# Patient Record
Sex: Female | Born: 1950 | Race: White | Hispanic: No | Smoking: Former smoker
Health system: Southern US, Community
[De-identification: ages and names within clinical notes are randomized; demographics above are authoritative.]

## PROBLEM LIST (undated history)

## (undated) DIAGNOSIS — I10 Essential (primary) hypertension: Secondary | ICD-10-CM

## (undated) DIAGNOSIS — M1990 Unspecified osteoarthritis, unspecified site: Secondary | ICD-10-CM

## (undated) DIAGNOSIS — I890 Lymphedema, not elsewhere classified: Secondary | ICD-10-CM

## (undated) DIAGNOSIS — G4730 Sleep apnea, unspecified: Secondary | ICD-10-CM

## (undated) DIAGNOSIS — E785 Hyperlipidemia, unspecified: Secondary | ICD-10-CM

## (undated) DIAGNOSIS — F32A Depression, unspecified: Secondary | ICD-10-CM

## (undated) DIAGNOSIS — R42 Dizziness and giddiness: Secondary | ICD-10-CM

## (undated) DIAGNOSIS — F419 Anxiety disorder, unspecified: Secondary | ICD-10-CM

## (undated) DIAGNOSIS — R519 Headache, unspecified: Secondary | ICD-10-CM

## (undated) DIAGNOSIS — Z87442 Personal history of urinary calculi: Secondary | ICD-10-CM

## (undated) DIAGNOSIS — N189 Chronic kidney disease, unspecified: Secondary | ICD-10-CM

## (undated) HISTORY — PX: EYE SURGERY: SHX253

## (undated) HISTORY — PX: APPENDECTOMY: SHX54

## (undated) HISTORY — PX: BREAST SURGERY: SHX581

## (undated) HISTORY — PX: JOINT REPLACEMENT: SHX530

---

## 2017-06-07 DIAGNOSIS — I82409 Acute embolism and thrombosis of unspecified deep veins of unspecified lower extremity: Secondary | ICD-10-CM

## 2017-06-07 HISTORY — DX: Acute embolism and thrombosis of unspecified deep veins of unspecified lower extremity: I82.409

## 2020-01-09 ENCOUNTER — Ambulatory Visit: Payer: Self-pay

## 2020-01-09 ENCOUNTER — Ambulatory Visit: Payer: Medicare Other | Admitting: Orthopaedic Surgery

## 2020-01-09 VITALS — Ht 65.0 in | Wt 237.0 lb

## 2020-01-09 DIAGNOSIS — M1611 Unilateral primary osteoarthritis, right hip: Secondary | ICD-10-CM | POA: Diagnosis not present

## 2020-01-09 DIAGNOSIS — Z96651 Presence of right artificial knee joint: Secondary | ICD-10-CM | POA: Diagnosis not present

## 2020-01-09 DIAGNOSIS — M25551 Pain in right hip: Secondary | ICD-10-CM | POA: Diagnosis not present

## 2020-01-09 NOTE — Progress Notes (Signed)
Office Visit Note   Patient: Monica Serrano           Date of Birth: July 15, 1950           MRN: [id number] Visit Date: 01/09/2020              Requested by: Eulis Foster, MD [address] [place],  [place] [address] PCP: Eulis Foster, MD   Assessment & Plan: Visit Diagnoses:  1. Pain in right hip   2. History of total right knee replacement   3. Unilateral primary osteoarthritis, right hip     Plan: Given the severity of her right hip arthritis on clinical exam and plain films, we are recommending a total hip arthroplasty.  She is a fall risk at this point given the severity of her hip pain on the right side.  I do feel this is causing her to have right knee issues as well and hopefully with a hip replacement this will correct a lot of those issues.  I had a long and thorough discussion about hip replacement surgery.  We talked about the risk of acute blood loss anemia, nerve vessel injury, soft tissue issues given her obesity, DVT, dislocation and implant failure.  We talked about the goals being decrease pain improve mobility and overall improve quality of life.  I talked about the interoperative and postoperative course and what to expect with surgery.  All questions and concerns were answered and addressed.  We will work on getting this scheduled.  I do feel this is medically necessary at this point given failed conservative treatment and given the severity of arthritis on plain films and clinical exam.  Follow-Up Instructions: Return for 2 weeks post-op.   Orders:  Orders Placed This Encounter  Procedures  . XR HIP UNILAT W OR W/O PELVIS 1V RIGHT  . XR Knee 1-2 Views Right   No orders of the defined types were placed in this encounter.     Procedures: No procedures performed   Clinical Data: No additional findings.   Subjective: Chief Complaint  Patient presents with  . Right Hip - Pain  . Right Knee - Pain  Patient is a very pleasant  69 year old who comes from [facility] to evaluate and treat severe right hip pain.  She does have a history of bilateral knee replacements.  She ambulates with a cane.  Her knee replacements are done in [place].  Her right knee was replaced in 2019 her left knee in 2018.  She still having a lot of problems which she feels is clicking in her right knee but most of her problems is severe right hip and groin pain.  She is not a diabetic.  She is 237 pounds with a BMI of 39.  She is worked on losing weight and has lost about 35 pounds.  She has a lot of problems with her gait and mobility.  She does ambulate with a Trendelenburg gait and holding onto walls when I watched her walk around the office.  Her hip pain is 10 out of 10 and is daily.  Her right hip is now detrimentally affecting her mobility, her quality of life and her actives daily living.  This is been going on for over a year and getting significantly worse.  She has tried and failed all forms conservative treatment and has had even a right hip steroid injection which helped minimally.  HPI  Review of Systems She currently denies any headache,  chest pain, shortness of breath, fever, chills, nausea, vomiting  Objective: Vital Signs: Ht 5\' 5"  (1.651 m)   Wt 237 lb (107.5 kg)   BMI 39.44 kg/m   Physical Exam She is alert and orient x3 and in no acute distress Ortho Exam Examination of her right hip she is essentially severe limitations in range of motion with stiffness and severe pain.  There is actually a crepitation of the hip that is radiating down to the knee.  Both the right and left knees have good flexion and extension.  There is some slight clicking more in the right knee than left knee. Specialty Comments:  No specialty comments available.  Imaging: XR HIP UNILAT W OR W/O PELVIS 1V RIGHT  Result Date: 01/09/2020 A low AP pelvis lateral the right hip shows severe end-stage arthritis of that hip.  There is a large  cystic change in the femoral head and acetabulum.  There is collapse of the femoral head and essentially loss of the joint space.  There is large para-articular osteophytes and sclerotic changes.  XR Knee 1-2 Views Right  Result Date: 01/09/2020 2 views of the right knee show a well-seated total knee arthroplasty with no complicating features.    PMFS History: Patient Active Problem List   Diagnosis Date Noted  . Unilateral primary osteoarthritis, right hip 01/09/2020   No past medical history on file.  No family history on file.   Social History   Occupational History  . Not on file  Tobacco Use  . Smoking status: Not on file  Substance and Sexual Activity  . Alcohol use: Not on file  . Drug use: Not on file  . Sexual activity: Not on file

## 2020-01-16 ENCOUNTER — Other Ambulatory Visit: Payer: Self-pay

## 2020-02-05 ENCOUNTER — Encounter (HOSPITAL_COMMUNITY): Admission: RE | Admit: 2020-02-05 | Payer: Medicare Other | Source: Ambulatory Visit

## 2020-02-05 ENCOUNTER — Other Ambulatory Visit (HOSPITAL_COMMUNITY): Payer: Medicare Other

## 2020-02-08 ENCOUNTER — Ambulatory Visit: Admit: 2020-02-08 | Payer: Medicare Other | Admitting: Orthopaedic Surgery

## 2020-02-08 SURGERY — ARTHROPLASTY, HIP, TOTAL, ANTERIOR APPROACH
Anesthesia: Spinal | Site: Hip | Laterality: Right

## 2020-02-21 ENCOUNTER — Inpatient Hospital Stay: Payer: Medicare Other | Admitting: Orthopaedic Surgery

## 2020-03-12 ENCOUNTER — Other Ambulatory Visit: Payer: Self-pay

## 2020-03-14 ENCOUNTER — Other Ambulatory Visit: Payer: Self-pay | Admitting: Physician Assistant

## 2020-03-18 ENCOUNTER — Encounter (HOSPITAL_COMMUNITY): Payer: Self-pay

## 2020-03-18 ENCOUNTER — Other Ambulatory Visit (HOSPITAL_COMMUNITY)
Admission: RE | Admit: 2020-03-18 | Discharge: 2020-03-18 | Disposition: A | Discharge status: Home / Self Care | Payer: Medicare Other | Source: Ambulatory Visit | Attending: Orthopaedic Surgery | Admitting: Orthopaedic Surgery

## 2020-03-18 ENCOUNTER — Other Ambulatory Visit: Payer: Self-pay

## 2020-03-18 ENCOUNTER — Encounter (HOSPITAL_COMMUNITY)
Admission: RE | Admit: 2020-03-18 | Discharge: 2020-03-18 | Disposition: A | Discharge status: Home / Self Care | Payer: Medicare Other | Source: Ambulatory Visit | Attending: Orthopaedic Surgery | Admitting: Orthopaedic Surgery

## 2020-03-18 DIAGNOSIS — Z01818 Encounter for other preprocedural examination: Secondary | ICD-10-CM | POA: Insufficient documentation

## 2020-03-18 DIAGNOSIS — Z01810 Encounter for preprocedural cardiovascular examination: Secondary | ICD-10-CM | POA: Diagnosis present

## 2020-03-18 DIAGNOSIS — Z01812 Encounter for preprocedural laboratory examination: Secondary | ICD-10-CM | POA: Diagnosis not present

## 2020-03-18 DIAGNOSIS — Z20822 Contact with and (suspected) exposure to covid-19: Secondary | ICD-10-CM | POA: Insufficient documentation

## 2020-03-18 HISTORY — DX: Personal history of urinary calculi: Z87.442

## 2020-03-18 HISTORY — DX: Lymphedema, not elsewhere classified: I89.0

## 2020-03-18 HISTORY — DX: Unspecified osteoarthritis, unspecified site: M19.90

## 2020-03-18 HISTORY — DX: Dizziness and giddiness: R42

## 2020-03-18 HISTORY — DX: Anxiety disorder, unspecified: F41.9

## 2020-03-18 HISTORY — DX: Hyperlipidemia, unspecified: E78.5

## 2020-03-18 HISTORY — DX: Essential (primary) hypertension: I10

## 2020-03-18 HISTORY — DX: Sleep apnea, unspecified: G47.30

## 2020-03-18 HISTORY — DX: Headache, unspecified: R51.9

## 2020-03-18 HISTORY — DX: Depression, unspecified: F32.A

## 2020-03-18 HISTORY — DX: Chronic kidney disease, unspecified: N18.9

## 2020-03-18 LAB — BASIC METABOLIC PANEL
Anion gap: 12 (ref 5–15)
BUN: 26 mg/dL — ABNORMAL HIGH (ref 8–23)
CO2: 24 mmol/L (ref 22–32)
Calcium: 9.5 mg/dL (ref 8.9–10.3)
Chloride: 100 mmol/L (ref 98–111)
Creatinine, Ser: 2.03 mg/dL — ABNORMAL HIGH (ref 0.44–1.00)
GFR, Estimated: 24 mL/min — ABNORMAL LOW (ref >60)
Glucose, Bld: 107 mg/dL — ABNORMAL HIGH (ref 70–99)
Potassium: 4.8 mmol/L (ref 3.5–5.1)
Sodium: 136 mmol/L (ref 135–145)

## 2020-03-18 LAB — SURGICAL PCR SCREEN
MRSA, PCR: NEGATIVE
Staphylococcus aureus: POSITIVE — AB

## 2020-03-18 LAB — CBC
HCT: 43.4 % (ref 36.0–46.0)
Hemoglobin: 13.2 g/dL (ref 12.0–15.0)
MCH: 27.7 pg (ref 26.0–34.0)
MCHC: 30.4 g/dL (ref 30.0–36.0)
MCV: 91.2 fL (ref 80.0–100.0)
Platelets: 439 10*3/uL — ABNORMAL HIGH (ref 150–400)
RBC: 4.76 MIL/uL (ref 3.87–5.11)
RDW: 15.4 % (ref 11.5–15.5)
WBC: 9.8 10*3/uL (ref 4.0–10.5)
nRBC: 0 % (ref 0.0–0.2)

## 2020-03-18 LAB — TYPE AND SCREEN
ABO/RH(D): B POS
Antibody Screen: NEGATIVE

## 2020-03-18 LAB — SARS CORONAVIRUS 2 (TAT 6-24 HRS): SARS Coronavirus 2: NEGATIVE

## 2020-03-18 NOTE — Progress Notes (Addendum)
[facility].  Cardiologist - none  Chest x-ray - na  EKG - 03/18/20  Stress Test - never  ECHO - never  Cardiac Cath - never  Sleep Study -  CPAP -   LABS-CBC, BMP, PCR ASA-  ERAS-Yes.  No Food after midnight- clear liquids until 0630- Drink Pre Surgery Ensure between 0530 and 0630.  HA1C-na Fasting Blood Sugar - na Checks Blood Sugar __na___ times a day  I instructed patient to stop Phentermine and Vitamins.  Anesthesia- History of Vertigo- takes Phenergan and lies down.  Last attack was 6 months ago. Pt denies having chest pain, sob, or fever at this time. All instructions explained to the pt, with a verbal understanding of the material. Pt agrees to go over the instructions while at home for a better understanding. Pt also instructed to self quarantine after being tested for COVID-19. The opportunity to ask questions was provided.

## 2020-03-18 NOTE — Pre-Procedure Instructions (Addendum)
Monica Serrano  03/18/2020      Your procedure is scheduled on Friday, October 15.  Report to [facility], Main Entrance or Entrance "A" at 7:30 AM                     Your surgery or procedure is scheduled to begin at   Call this number if you have problems the morning of surgery: [phone]  This is the number for the Pre- Surgical Desk.                For any other questions, please call [phone], Monday - Friday 8 AM - 4 PM.   Remember:  Do not eat  after midnight Thursday, October 14.  You may drink clear liquids until 6:30 AM .  Clear liquids allowed are:                     Water, Juice (non-citric and without pulp - diabetics please choose diet or no sugar options), Carbonated beverages - (diabetics please choose diet or no sugar options), Clear Tea, Black Coffee only (no creamer, milk or cream including half and half), Plain Jell-O only (diabetics please choose diet or no sugar options), Gatorade (diabetics please choose diet or no sugar options) and Plain Popsicles only.  Drink Pre- Surgery between 5:30 AM and 6:30 AM , then DO NOT drink anything after 6:30 AM     Take these medicines the morning of surgery with A SIP OF WATER: atorvastatin (LIPITOR) DULoxetine (CYMBALTA)  gabapentin (NEURONTIN) lansoprazole (PREVACID)  May take if needed:  acetaminophen (TYLENOL)   cetirizine (ZYRTEC)  fluticasone (FLONASE)  oxyCODONE-acetaminophen (PERCOCET)   1 Week prior to surgery STOP taking phentermine Aspirin, Aspirin Products (Goody Powder, Excedrin Migraine), Ibuprofen (Advil), Naproxen (Aleve), Vitamins and Herbal Products (ie Fish Oil).    Special instructions:    [place]- Preparing For Surgery  Before surgery, you can play an important role. Because skin is not sterile, your skin needs to be as free of germs as possible. You can reduce the number of germs on your skin by washing with CHG (chlorahexidine gluconate) Soap before surgery.  CHG is an  antiseptic cleaner which kills germs and bonds with the skin to continue killing germs even after washing.    Oral Hygiene is also important to reduce your risk of infection.  Remember - BRUSH YOUR TEETH THE MORNING OF SURGERY WITH YOUR REGULAR TOOTHPASTE  Please do not use if you have an allergy to CHG or antibacterial soaps. If your skin becomes reddened/irritated stop using the CHG.  Do not shave (including legs and underarms) for at least 48 hours prior to first CHG shower. It is OK to shave your face.  Please follow these instructions carefully.   1. Shower the NIGHT BEFORE SURGERY and the MORNING OF SURGERY with CHG.   2. If you chose to wash your hair, wash your hair first as usual with your normal shampoo.  3. After you shampoo, wash your face and private area with the soap you use at home, then rinse your hair and body thoroughly to remove the shampoo and soap.  4. Use CHG as you would any other liquid soap. You can apply CHG directly to the skin and wash gently with a scrungie or a clean washcloth.   5. Apply the CHG Soap to your body ONLY FROM THE NECK DOWN.  Do not use on open wounds or open  sores. Avoid contact with your eyes, ears, mouth and genitals (private parts).   6. Wash thoroughly, paying special attention to the area where your surgery will be performed.  7. Thoroughly rinse your body with warm water from the neck down.  8. DO NOT shower/wash with your normal soap after using and rinsing off the CHG Soap.  9. Pat yourself dry with a CLEAN TOWEL.  10. Wear CLEAN PAJAMAS to bed the night before surgery, wear comfortable clothes the morning of surgery  11. Place CLEAN SHEETS on your bed the night of your first shower and DO NOT SLEEP WITH PETS.  Day of Surgery: Shower as instructed above. Do not apply any deodorants/lotions, powders or colognes.  Please wear clean clothes to the hospital/surgery center.   Remember to brush your teeth WITH YOUR REGULAR  TOOTHPASTE.  Do not wear jewelry, make-up or nail polish.  Do not shave 48 hours prior to surgery.  Men may shave face and neck.  Do not bring valuables to the hospital.  [facility] is not responsible for any belongings or valuables.  Contacts, dentures or bridgework may not be worn into surgery.  Leave your suitcase in the car.  After surgery it may be brought to your room.  For patients admitted to the hospital, discharge time will be determined by your treatment team.  Patients discharged the day of surgery will not be allowed to drive home.   Please read over the fact sheets that you were given.

## 2020-03-19 ENCOUNTER — Encounter (HOSPITAL_COMMUNITY): Payer: Self-pay

## 2020-03-19 NOTE — Progress Notes (Addendum)
Anesthesia Chart Review:  Case: [id number] Date/Time: 03/21/20 0915   Procedure: RIGHT TOTAL HIP ARTHROPLASTY ANTERIOR APPROACH (Right Hip)   Anesthesia type: Spinal   Pre-op diagnosis: osteoarthritis right hip   Location: [facility] OR ROOM 02 / [place] OR   Surgeons: Mcarthur Rossetti, MD      DISCUSSION: Patient is a 69 year old female scheduled for the above procedure.  History includes former smoker (quit 06/07/86), HTN, HLD, OSA (severe 12/2018 sleep study, inconsistent use of CPAP), RLE DVT (post-TKA 2019) , migraines, anxiety, depression, vertigo, lymphedema (BLE), TKA (left 08/17/16, right 09/06/17). BMI is consistent with obesity.   Preoperative labs show a Creatinine of 2.03, BUN 26, eGFR 24. Last available Creatinine in Care Everywhere was 0.73 on 10/05/18. Dr Deland Pretty is out of the office on the afternoon of 03/19/20, but I spoke with Sharilyn Sites, NP who was able to tell me that her last labs on 12/06/19 showed a Cr 1.37, BUN 22, GFR 40. Dr. Deland Pretty will be back in the office on 03/20/20, so labs will be forwarded for his review and recommendations.  I did contact Ms. Uzelac to review her lab results and that we were seeking primary care input to determine if this was more of an acute or chronic issue. She believes her Creatinine has been mildly elevated in the past and thought she may have had a renal U/S. A few months ago, she had taken ibuprofen 800 mg TID x 4 doses for right knee pain, but otherwise denied any long term recent NSAID use. She denied any new urinary symptoms (has chronic frequent urination) and feels lymphedema is controlled (using Lymphopress BID). Spironolactone 25 mg was stopped about 6-8 months ago, but she continued on Lasix 40 mg daily and candesartan 16 mg daily. She reports what sounds like intentional weight loss of 35 lbs since Mother's Day in preparation for surgery. She is a widow since 2017 and lives alone. She reportedly has arranged family to assist her for 8 days after  surgery, but is still concerned she may need higher level of care given some right knee popping and pain as well (Dr. Ninfa Linden to follow post-op).   Presurgical COVID-19 test negative on 03/18/2020. PAT labs and EKG forwarded to Dr. Quenten Raven. Will leave chart for follow-up Dr. Deland Pretty recommendations.   ADDENDUM 03/20/20 9:20 AM: Received input from Dr. Deland Pretty after review of 03/18/20 EKG and labs. He wrote, "THIS PT HAS CHRONIC RENAL INSUF WITH CREAT'S FROM 1.1 TO 1.9 FOR THE PAST 1-2 YEARS. APPROVED TO PROCEED WITH SURGERY AS SCHEDULED." History updated. Message sent to Dr. Ninfa Linden with update. Anesthesia team to evaluate on the day of surgery.    VS: BP (!) 111/56    Pulse 86    Temp 36.9 C (Oral)    Resp 20    Ht $R'5\' 5"'Uh$  (1.651 m)    Wt 103.3 kg    SpO2 100%    BMI 37.89 kg/m    PROVIDERS: Mike Gip, MD is PCP [facility]) - She received pain management at [facility] by Mauricio Po, PA-C. Last visit 03/17/20 and is aware of surgery plans.   LABS: Preoperative labs reviewed. See DISCUSSION. (all labs ordered are listed, but only abnormal results are displayed)  Labs Reviewed  SURGICAL PCR SCREEN - Abnormal; Notable for the following components:      Result Value   Staphylococcus aureus POSITIVE (*)    All other components within normal limits   Lab Results  Component Value Date   WBC 9.8 03/18/2020   HGB 13.2 03/18/2020   HCT 43.4 03/18/2020   PLT 439 (H) 03/18/2020   GLUCOSE 107 (H) 03/18/2020   NA 136 03/18/2020   K 4.8 03/18/2020   CL 100 03/18/2020   CREATININE 2.03 (H) 03/18/2020   BUN 26 (H) 03/18/2020   CO2 24 03/18/2020    OTHER:  - [facility]/29/18 [facility]): ASSESSMENT: Severe obstructive sleep apneawith an AHI of 81.1 events/hr, arousal index of 57.9 per hour and a low saturation of 64.0%. There were moderate PLMS with an index of 41.8 /hr and PLMS arousal index of 0.8  /hr. EKG indicates normal sinusrhythm. EEG is normal.  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 There were no PLMS with an index of 0.0 /hr and PLMS arousal index of 0.0 /hr. EKG indicates normal sinus rhythm. EEG is normal. RECOMMENDATIONS: A titration of VPAP with ASV is recommended for this patient with complex apnea syndrome poorly tolerant of CPAP and Bilevel. If the patient is poorly tolerant to PAP, further anatomic evaluation for possible surgical intervention may also be considered...   EKG: 03/18/20:  Normal sinus rhythm Low voltage QRS Poor R wave progression Cannot rule out Anterior infarct , age undetermined Abnormal ECG No old tracing to compare Confirmed by Candee Furbish [phone]) on 03/18/2020 8:29:50 PM   CV: Denied prior stress test, echocardiogram, cardiac cath   Past Medical History:  Diagnosis Date   Anxiety    CKD (chronic kidney disease)    Depression    DVT, lower extremity ([place]) 2019   right leg   Headache    Migraines - years ago   History of kidney stones    passed   Hyperlipemia    Hypertension    Lymphedema    Osteoarthritis    Sleep apnea    Vertigo     Past Surgical History:  Procedure Laterality Date   BREAST SURGERY Right    Biospy   EYE SURGERY Bilateral    Catartact   JOINT REPLACEMENT     knees    MEDICATIONS:  acetaminophen (TYLENOL) 500 MG tablet   atorvastatin (LIPITOR) 20 MG tablet   calcium carbonate (OSCAL) 1500 (600 Ca) MG TABS tablet   candesartan (ATACAND) 16 MG tablet   cetirizine (ZYRTEC) 10 MG tablet   cholecalciferol (VITAMIN D3) 25 MCG (1000 UNIT) tablet   docusate sodium (COLACE) 100 MG capsule   DULoxetine (CYMBALTA) 60 MG capsule   fluticasone (FLONASE) 50 MCG/ACT nasal spray   furosemide (LASIX) 40 MG tablet    gabapentin (NEURONTIN) 300 MG capsule   ibuprofen (ADVIL) 200 MG tablet   lansoprazole (PREVACID) 30 MG capsule   oxyCODONE-acetaminophen (PERCOCET) 7.5-325 MG tablet   phentermine 30 MG capsule   promethazine (PHENERGAN) 25 MG tablet   rOPINIRole (REQUIP) 1 MG tablet   spironolactone (ALDACTONE) 25 MG tablet   No current facility-administered medications for this encounter.  Phentermine on hold for surgery. Off spironolactone 25 mg daily--off 6-8 months.      Myra Gianotti, PA-C Surgical Short Stay/Anesthesiology [facility] Phone [phone] [facility] Phone [phone] 03/19/2020 4:57 PM

## 2020-03-20 ENCOUNTER — Encounter (HOSPITAL_COMMUNITY): Payer: Self-pay

## 2020-03-20 NOTE — Anesthesia Preprocedure Evaluation (Addendum)
Anesthesia Evaluation  Patient identified by MRN, date of birth, ID band Patient awake    Reviewed: Allergy & Precautions, H&P , NPO status , Patient's Chart, lab work & pertinent test results  Airway Mallampati: II   Neck ROM: full    Dental   Pulmonary sleep apnea , former smoker,    breath sounds clear to auscultation       Cardiovascular hypertension,  Rhythm:regular Rate:Normal     Neuro/Psych  Headaches, PSYCHIATRIC DISORDERS Anxiety Depression    GI/Hepatic   Endo/Other    Renal/GU Renal disease     Musculoskeletal  (+) Arthritis ,   Abdominal   Peds  Hematology   Anesthesia Other Findings   Reproductive/Obstetrics                            Anesthesia Physical Anesthesia Plan  ASA: III  Anesthesia Plan: Spinal and MAC   Post-op Pain Management:    Induction: Intravenous  PONV Risk Score and Plan: 2 and Ondansetron, Propofol infusion and Treatment may vary due to age or medical condition  Airway Management Planned: Simple Face Mask  Additional Equipment:   Intra-op Plan:   Post-operative Plan:   Informed Consent: I have reviewed the patients History and Physical, chart, labs and discussed the procedure including the risks, benefits and alternatives for the proposed anesthesia with the patient or authorized representative who has indicated his/her understanding and acceptance.       Plan Discussed with: CRNA, Anesthesiologist and Surgeon  Anesthesia Plan Comments: (PAT note written by Myra Gianotti, PA-C. His primary care approval for surgery. )      Anesthesia Quick Evaluation

## 2020-03-21 ENCOUNTER — Observation Stay (HOSPITAL_COMMUNITY): Payer: Medicare Other

## 2020-03-21 ENCOUNTER — Other Ambulatory Visit: Payer: Self-pay

## 2020-03-21 ENCOUNTER — Observation Stay (HOSPITAL_COMMUNITY)
Admission: RE | Admit: 2020-03-21 | Discharge: 2020-03-22 | Disposition: A | Discharge status: Home / Self Care | Payer: Medicare Other | Attending: Orthopaedic Surgery | Admitting: Orthopaedic Surgery

## 2020-03-21 ENCOUNTER — Ambulatory Visit (HOSPITAL_COMMUNITY): Payer: Medicare Other

## 2020-03-21 ENCOUNTER — Encounter (HOSPITAL_COMMUNITY): Payer: Self-pay | Admitting: Orthopaedic Surgery

## 2020-03-21 ENCOUNTER — Encounter (HOSPITAL_COMMUNITY)
Admission: RE | Disposition: A | Discharge status: Home / Self Care | Payer: Self-pay | Source: Home / Self Care | Attending: Orthopaedic Surgery

## 2020-03-21 ENCOUNTER — Ambulatory Visit (HOSPITAL_COMMUNITY): Payer: Medicare Other | Admitting: Vascular Surgery

## 2020-03-21 ENCOUNTER — Ambulatory Visit (HOSPITAL_COMMUNITY): Payer: Medicare Other | Admitting: Certified Registered"

## 2020-03-21 DIAGNOSIS — N189 Chronic kidney disease, unspecified: Secondary | ICD-10-CM | POA: Insufficient documentation

## 2020-03-21 DIAGNOSIS — I129 Hypertensive chronic kidney disease with stage 1 through stage 4 chronic kidney disease, or unspecified chronic kidney disease: Secondary | ICD-10-CM | POA: Diagnosis not present

## 2020-03-21 DIAGNOSIS — M1611 Unilateral primary osteoarthritis, right hip: Secondary | ICD-10-CM | POA: Diagnosis present

## 2020-03-21 DIAGNOSIS — Z96641 Presence of right artificial hip joint: Secondary | ICD-10-CM

## 2020-03-21 DIAGNOSIS — Z96653 Presence of artificial knee joint, bilateral: Secondary | ICD-10-CM | POA: Diagnosis not present

## 2020-03-21 DIAGNOSIS — Z87891 Personal history of nicotine dependence: Secondary | ICD-10-CM | POA: Diagnosis not present

## 2020-03-21 DIAGNOSIS — Z86718 Personal history of other venous thrombosis and embolism: Secondary | ICD-10-CM | POA: Insufficient documentation

## 2020-03-21 DIAGNOSIS — Z419 Encounter for procedure for purposes other than remedying health state, unspecified: Secondary | ICD-10-CM

## 2020-03-21 HISTORY — PX: TOTAL HIP ARTHROPLASTY: SHX124

## 2020-03-21 LAB — ABO/RH: ABO/RH(D): B POS

## 2020-03-21 IMAGING — RF DG C-ARM 1-60 MIN
1 series · 5 of 5 positions shown · non-contrast
Comparison: [DATE].

CLINICAL DATA: Right total hip arthroplasty.

EXAM:
OPERATIVE right HIP (WITH PELVIS IF PERFORMED) 5 VIEWS
TECHNIQUE: Fluoroscopic spot image(s) were submitted for interpretation
post-operatively.
Radiation exposure index: 3.6230 mGy.

[Series 1: unknown protocol · 0.20mm/px · 5 of 5 slices shown]
[im 1/5]
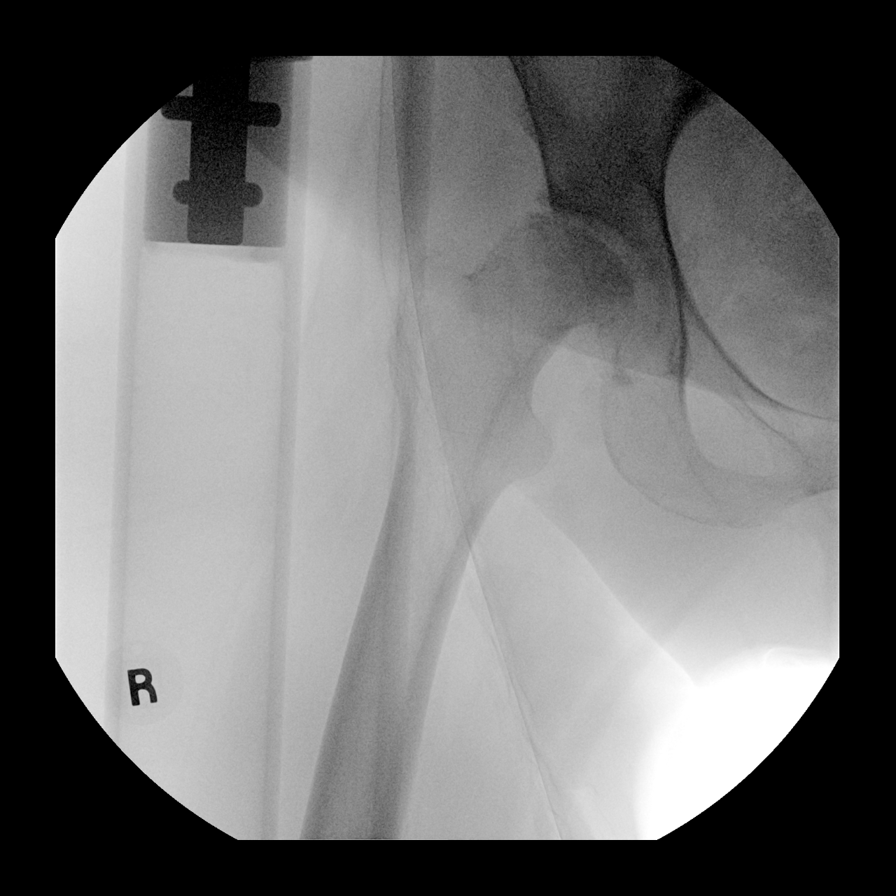
[im 2/5]
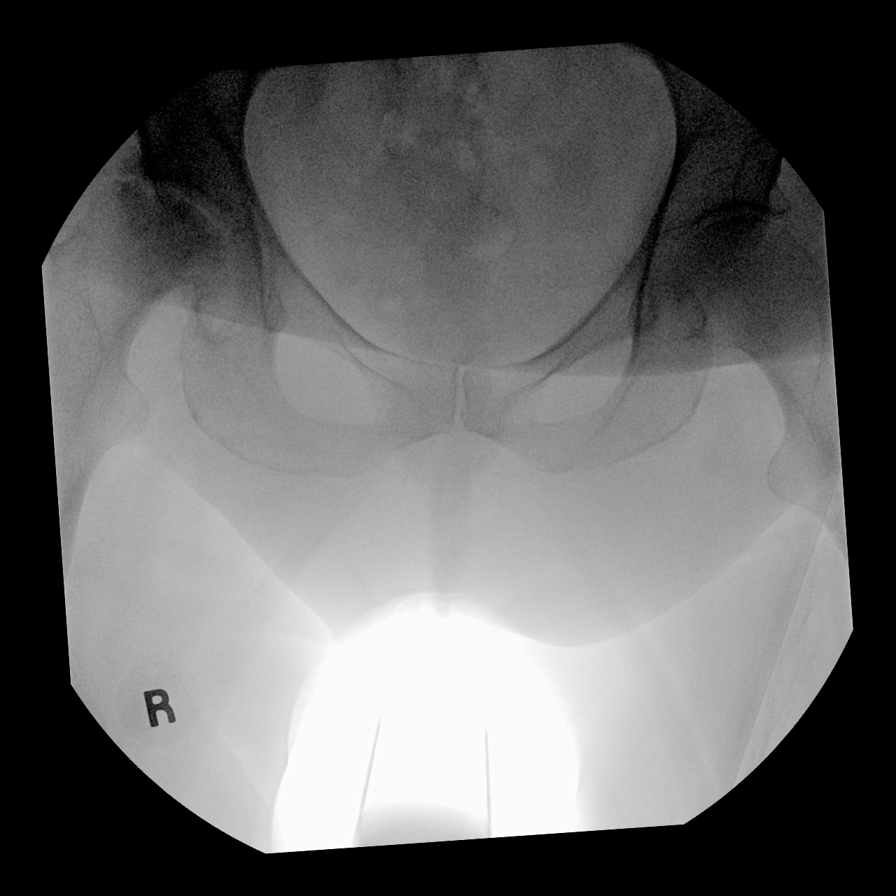
[im 3/5]
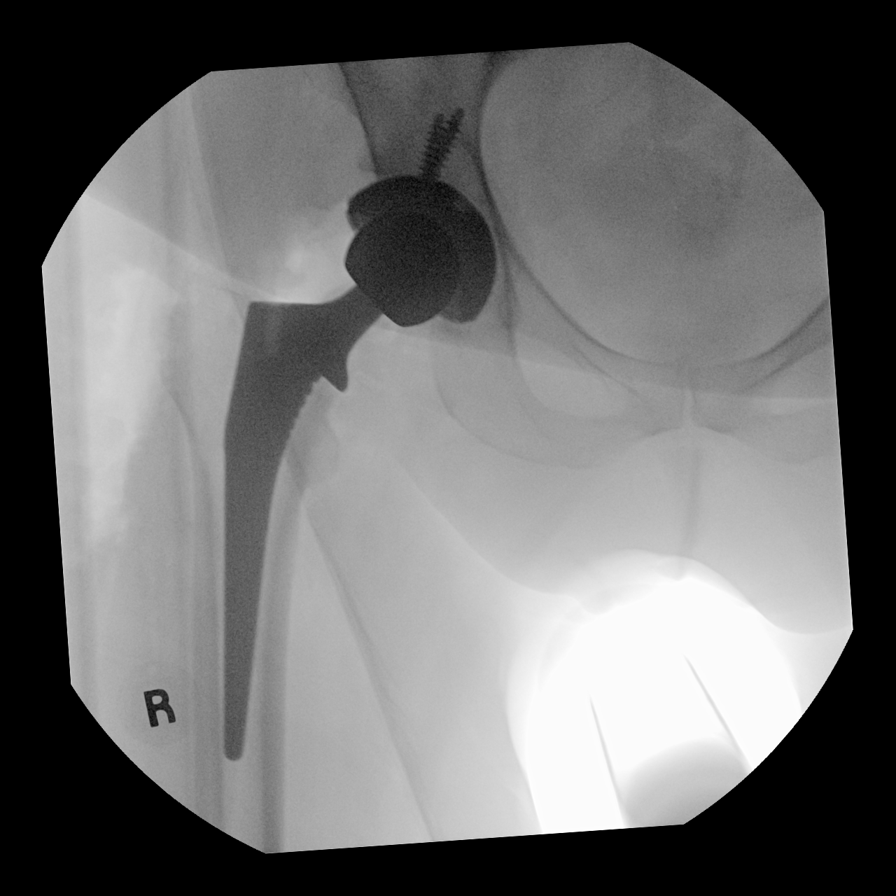
[im 4/5]
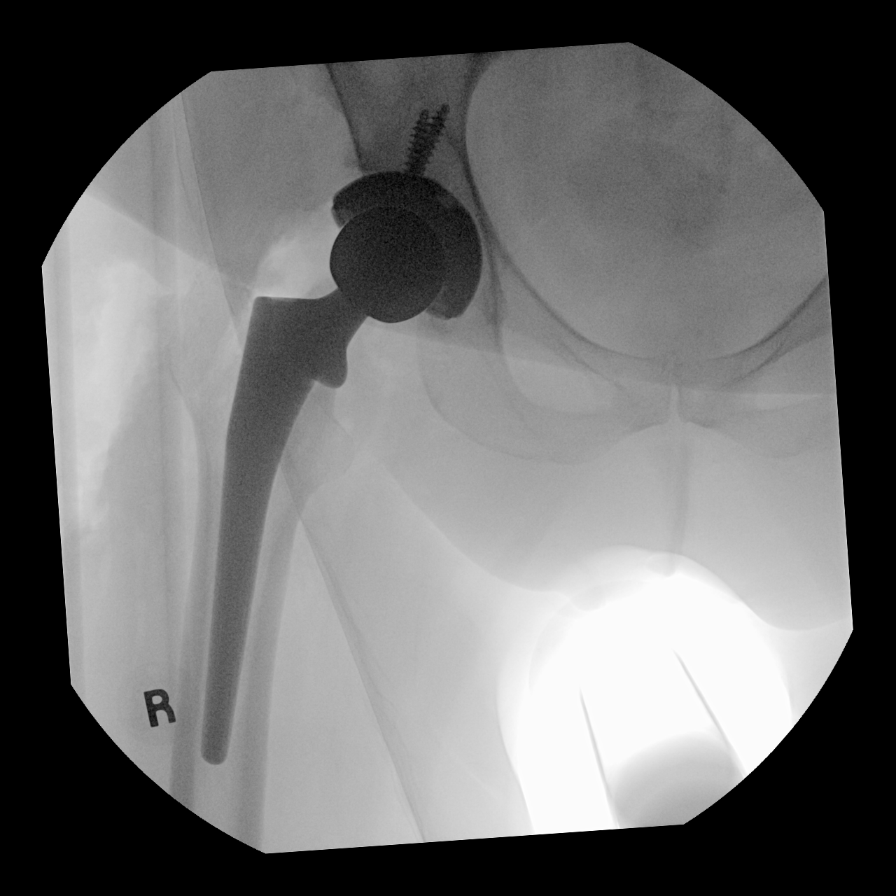
[im 5/5]
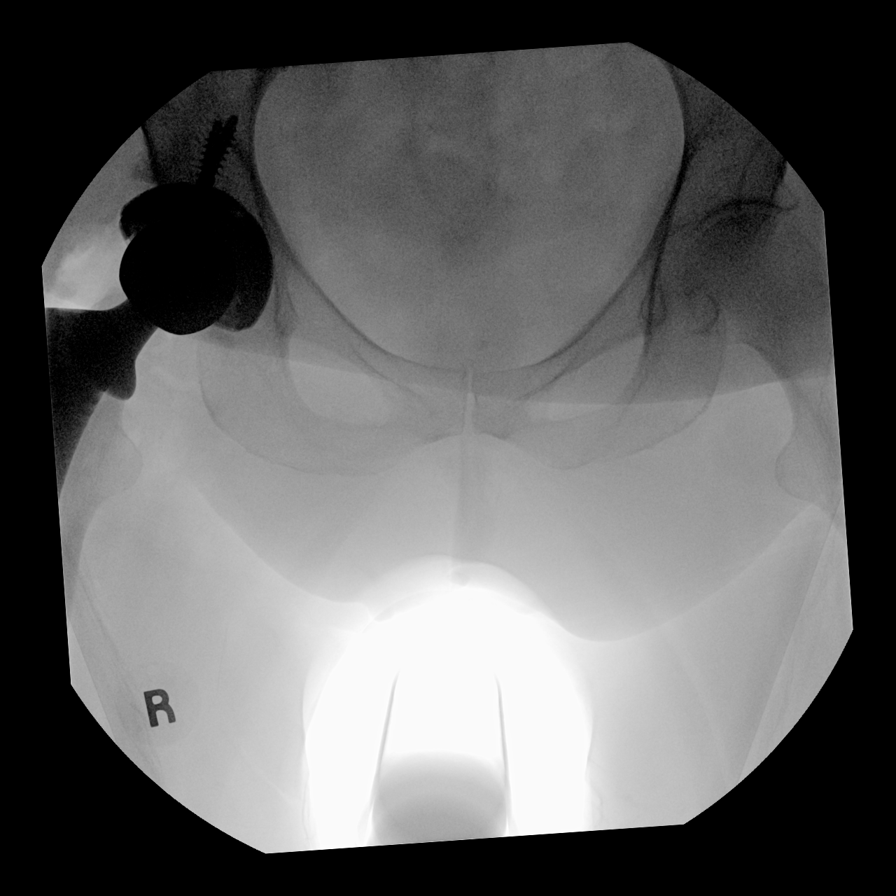

[5 of 5 positions shown; findings below may reference images not displayed]

FINDINGS: Five intraoperative fluoroscopic images were obtained of the right
hip. The right acetabular and femoral components appear to be well
situated.
IMPRESSION: Status post right total hip arthroplasty.

## 2020-03-21 IMAGING — DX DG PORTABLE PELVIS
1 series · 2 of 2 positions shown · non-contrast
Comparison: Intraoperative images [DATE];[DATE]
pelvis and right hip radiographs

CLINICAL DATA: Total hip replacement on right

EXAM:
PORTABLE PELVIS 1-2 VIEWS

[Series 1: pelvis ap · 0.14mm/px · 2 of 2 slices shown]
[im 1/2]
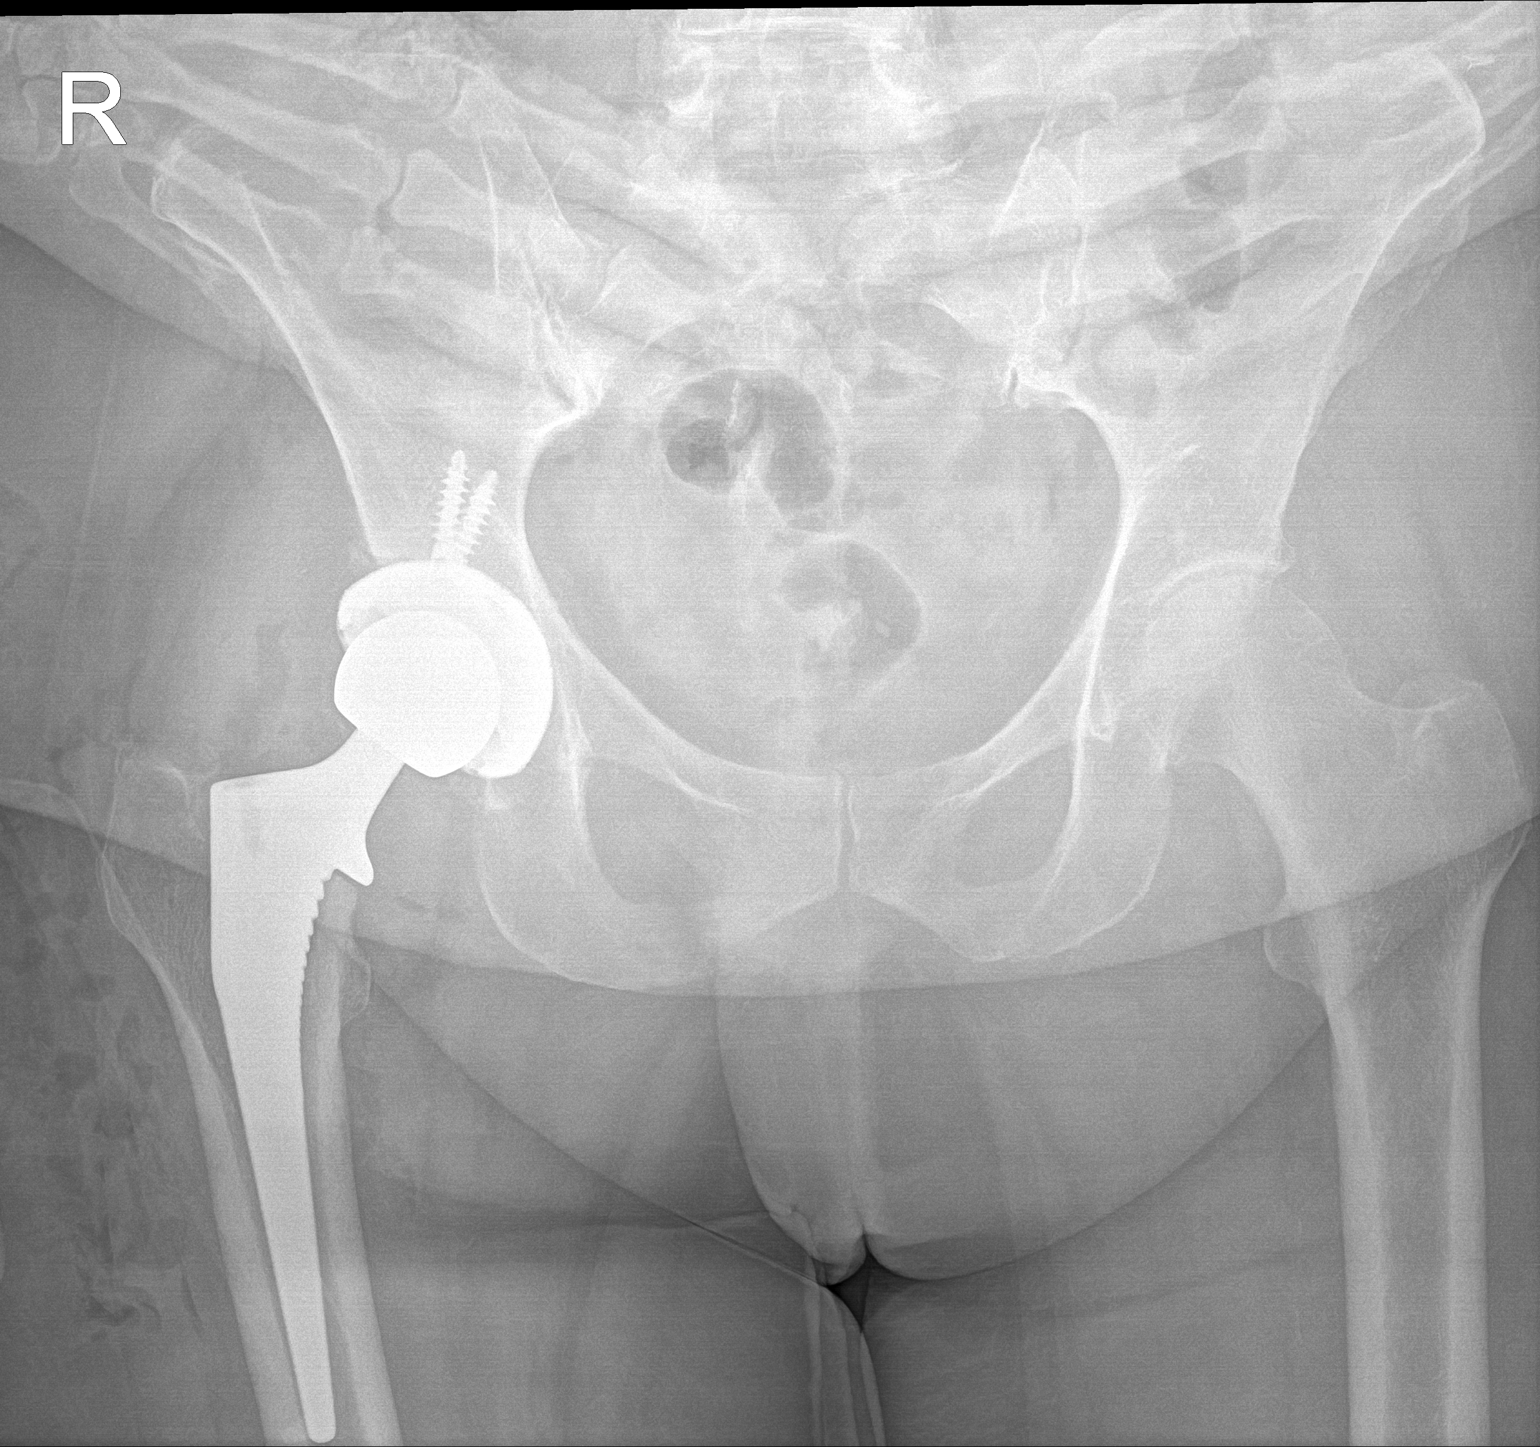
[im 2/2]
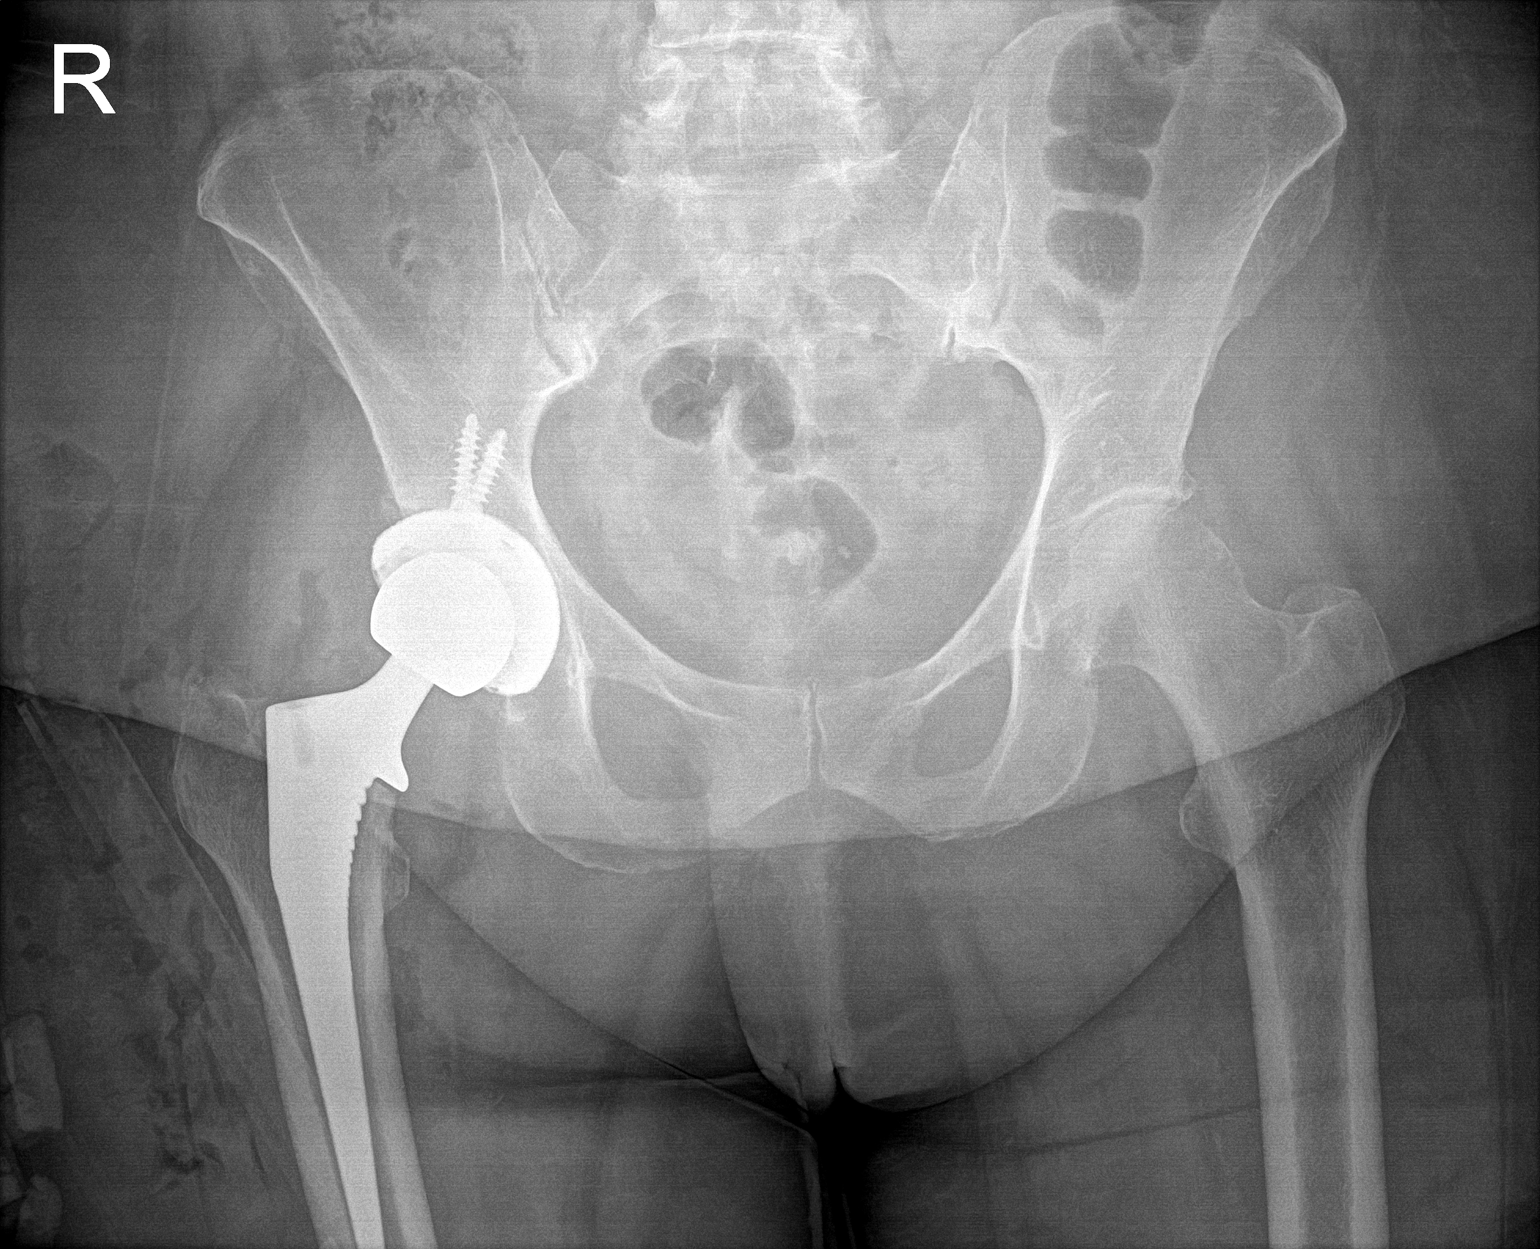

[2 of 2 positions shown; findings below may reference images not displayed]

FINDINGS: Frontal view obtained. There is a total hip replacement on the right
with prosthetic components well-seated on frontal view. No fracture
or dislocation. There is mild narrowing of the left hip joint.
IMPRESSION: Status post total hip replacement on the right with prosthetic
components well-seated on frontal view. No fracture or dislocation.
Narrowing left hip joint.

## 2020-03-21 IMAGING — RF DG HIP (WITH PELVIS) OPERATIVE*R*
1 series · 5 of 5 positions shown · non-contrast
Comparison: [DATE].

CLINICAL DATA: Right total hip arthroplasty.

EXAM:
OPERATIVE right HIP (WITH PELVIS IF PERFORMED) 5 VIEWS
TECHNIQUE: Fluoroscopic spot image(s) were submitted for interpretation
post-operatively.
Radiation exposure index: 3.6230 mGy.

[Series 1: unknown protocol · 0.20mm/px · 5 of 5 slices shown]
[im 1/5]
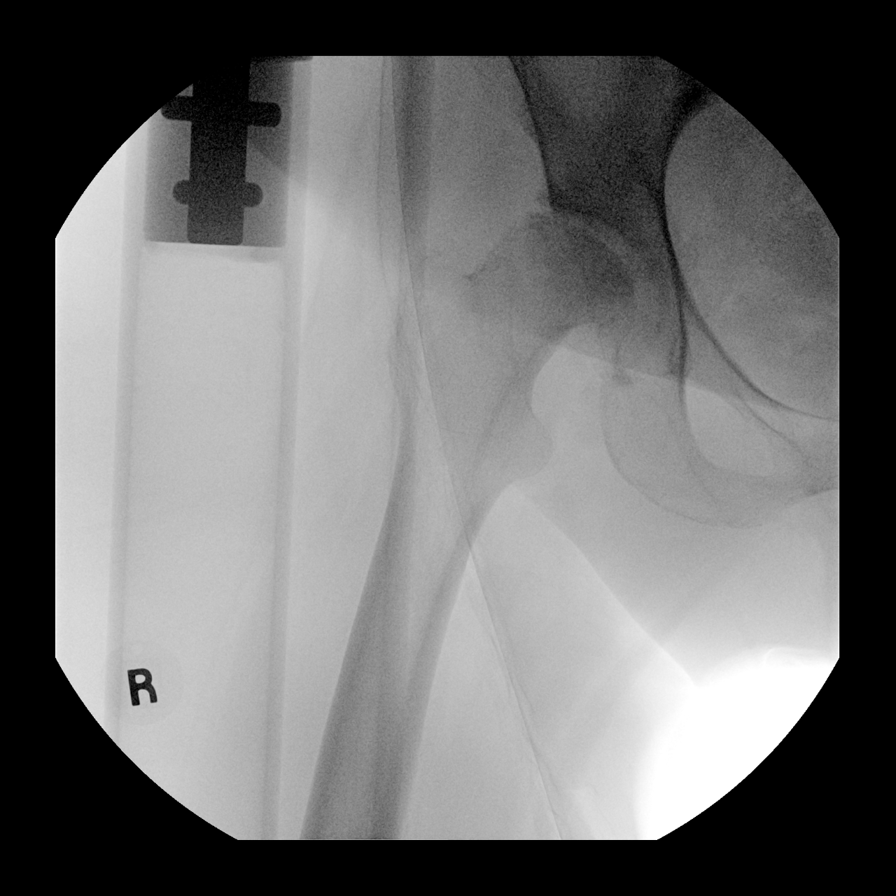
[im 2/5]
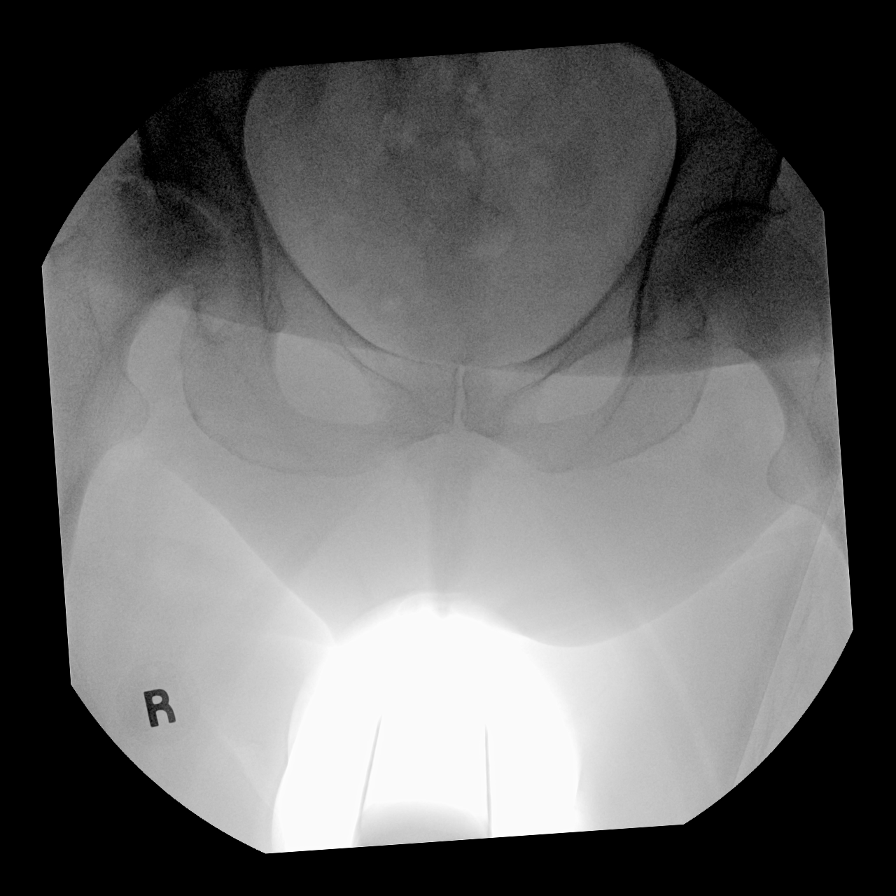
[im 3/5]
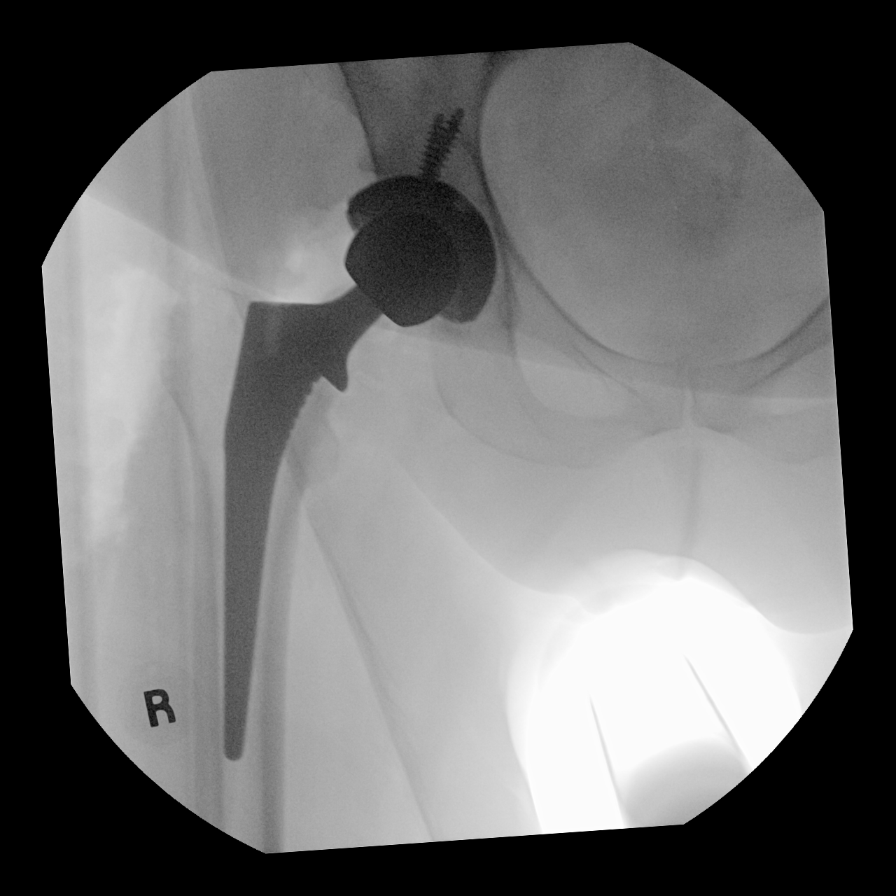
[im 4/5]
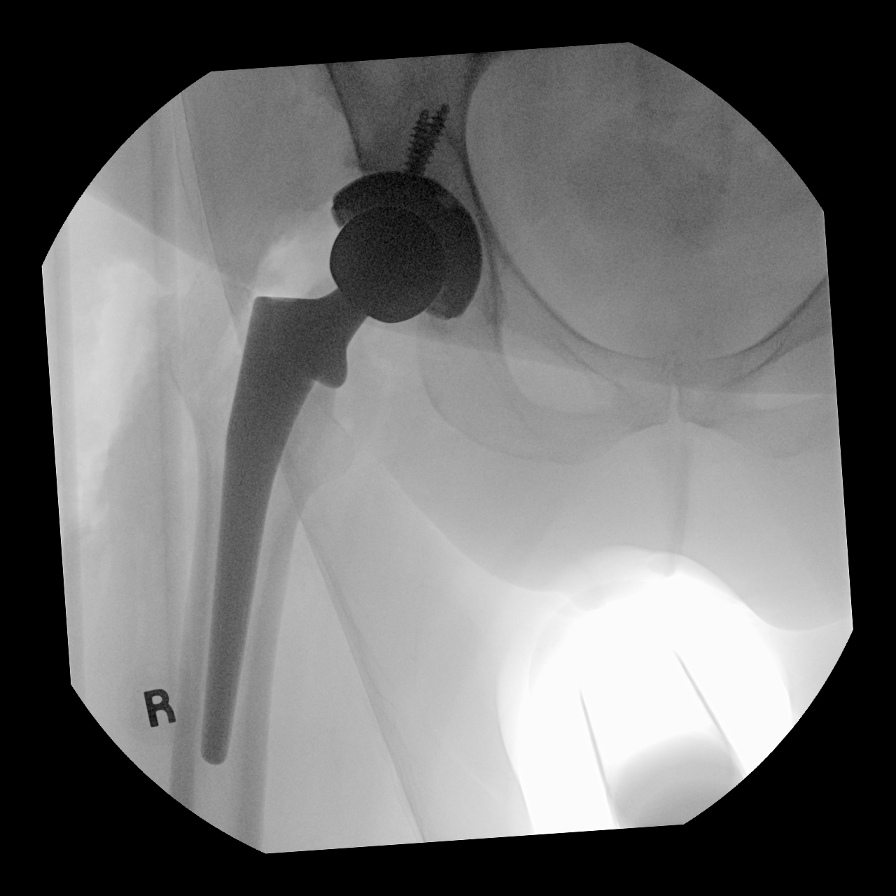
[im 5/5]
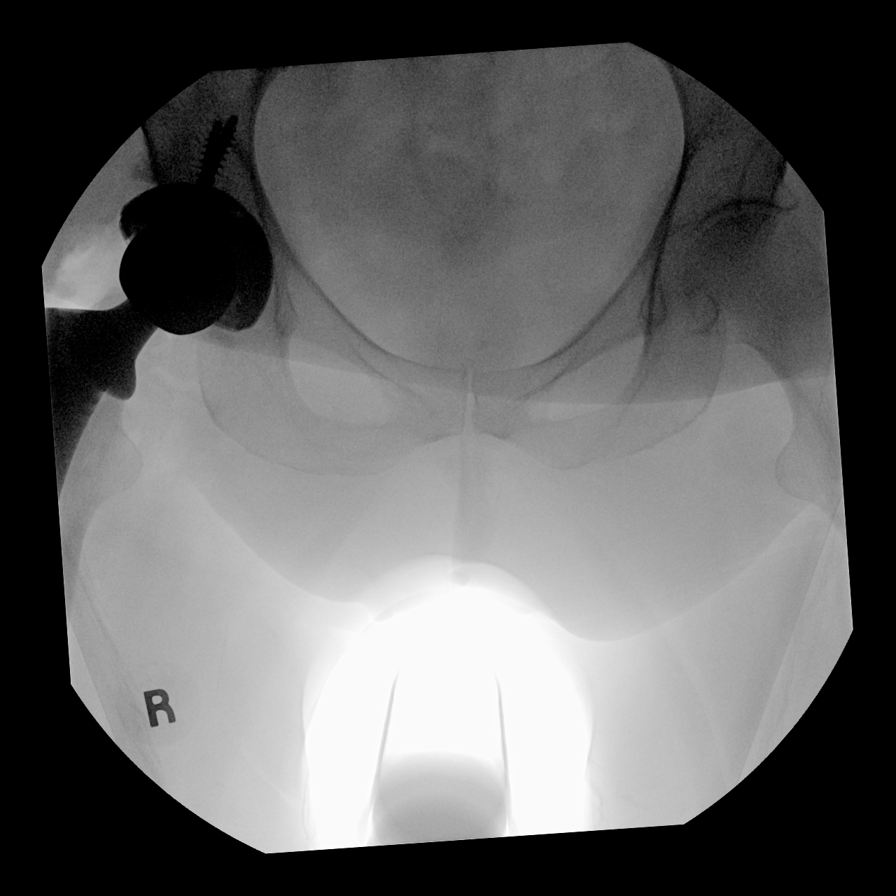

[5 of 5 positions shown; findings below may reference images not displayed]

FINDINGS: Five intraoperative fluoroscopic images were obtained of the right
hip. The right acetabular and femoral components appear to be well
situated.
IMPRESSION: Status post right total hip arthroplasty.

## 2020-03-21 SURGERY — ARTHROPLASTY, HIP, TOTAL, ANTERIOR APPROACH
Anesthesia: Monitor Anesthesia Care | Site: Hip | Laterality: Right

## 2020-03-21 MED ORDER — DULOXETINE HCL 60 MG PO CPEP
60.0000 mg | ORAL_CAPSULE | Freq: Every day | ORAL | Status: DC
  Administered 2020-03-21 – 2020-03-22 (×2): 60 mg via ORAL
  Filled 2020-03-21 (×2): qty 1

## 2020-03-21 MED ORDER — CEFAZOLIN SODIUM-DEXTROSE 1-4 GM/50ML-% IV SOLN
1.0000 g | Freq: Two times a day (BID) | INTRAVENOUS | Status: AC
  Administered 2020-03-21 (×2): 1 g via INTRAVENOUS
  Filled 2020-03-21: qty 50

## 2020-03-21 MED ORDER — ACETAMINOPHEN 325 MG PO TABS: 325.0000 mg | ORAL_TABLET | Freq: Four times a day (QID) | ORAL | Status: DC | PRN

## 2020-03-21 MED ORDER — ORAL CARE MOUTH RINSE: 15.0000 mL | Freq: Once | OROMUCOSAL | Status: DC

## 2020-03-21 MED ORDER — ASPIRIN 81 MG PO CHEW
81.0000 mg | CHEWABLE_TABLET | Freq: Two times a day (BID) | ORAL | Status: DC
  Administered 2020-03-21 – 2020-03-22 (×2): 81 mg via ORAL
  Filled 2020-03-21 (×2): qty 1

## 2020-03-21 MED ORDER — SODIUM CHLORIDE 0.9 % IR SOLN
Status: DC | PRN
  Administered 2020-03-21: 3000 mL

## 2020-03-21 MED ORDER — DOCUSATE SODIUM 100 MG PO CAPS
100.0000 mg | ORAL_CAPSULE | Freq: Two times a day (BID) | ORAL | Status: DC
  Administered 2020-03-21 – 2020-03-22 (×3): 100 mg via ORAL
  Filled 2020-03-21 (×3): qty 1

## 2020-03-21 MED ORDER — METOCLOPRAMIDE HCL 5 MG/ML IJ SOLN: 5.0000 mg | Freq: Three times a day (TID) | INTRAMUSCULAR | Status: DC | PRN

## 2020-03-21 MED ORDER — MIDAZOLAM HCL 2 MG/2ML IJ SOLN
INTRAMUSCULAR | Status: DC | PRN
  Administered 2020-03-21 (×2): 1 mg via INTRAVENOUS

## 2020-03-21 MED ORDER — ONDANSETRON HCL 4 MG/2ML IJ SOLN: 4.0000 mg | Freq: Four times a day (QID) | INTRAMUSCULAR | Status: DC | PRN

## 2020-03-21 MED ORDER — ONDANSETRON HCL 4 MG PO TABS: 4.0000 mg | ORAL_TABLET | Freq: Four times a day (QID) | ORAL | Status: DC | PRN

## 2020-03-21 MED ORDER — PROPOFOL 500 MG/50ML IV EMUL
INTRAVENOUS | Status: DC | PRN
  Administered 2020-03-21: 100 ug/kg/min via INTRAVENOUS

## 2020-03-21 MED ORDER — PHENYLEPHRINE HCL-NACL 10-0.9 MG/250ML-% IV SOLN
INTRAVENOUS | Status: DC | PRN
  Administered 2020-03-21: 50 ug/min via INTRAVENOUS

## 2020-03-21 MED ORDER — DEXAMETHASONE SODIUM PHOSPHATE 10 MG/ML IJ SOLN
INTRAMUSCULAR | Status: AC
  Filled 2020-03-21: qty 1

## 2020-03-21 MED ORDER — POLYETHYLENE GLYCOL 3350 17 G PO PACK: 17.0000 g | PACK | Freq: Every day | ORAL | Status: DC | PRN

## 2020-03-21 MED ORDER — ONDANSETRON HCL 4 MG/2ML IJ SOLN
INTRAMUSCULAR | Status: AC
  Filled 2020-03-21: qty 2

## 2020-03-21 MED ORDER — BUPIVACAINE IN DEXTROSE 0.75-8.25 % IT SOLN
INTRATHECAL | Status: DC | PRN
  Administered 2020-03-21: 1.8 mL via INTRATHECAL

## 2020-03-21 MED ORDER — FUROSEMIDE 40 MG PO TABS
40.0000 mg | ORAL_TABLET | Freq: Every day | ORAL | Status: DC
  Administered 2020-03-21 – 2020-03-22 (×2): 40 mg via ORAL
  Filled 2020-03-21 (×2): qty 1

## 2020-03-21 MED ORDER — ROCURONIUM BROMIDE 10 MG/ML (PF) SYRINGE
PREFILLED_SYRINGE | INTRAVENOUS | Status: AC
  Filled 2020-03-21: qty 10

## 2020-03-21 MED ORDER — OXYCODONE HCL 5 MG PO TABS: 10.0000 mg | ORAL_TABLET | ORAL | Status: DC | PRN

## 2020-03-21 MED ORDER — HYDROMORPHONE HCL 1 MG/ML IJ SOLN
0.5000 mg | INTRAMUSCULAR | Status: DC | PRN
  Administered 2020-03-21 – 2020-03-22 (×4): 1 mg via INTRAVENOUS
  Filled 2020-03-21 (×4): qty 1

## 2020-03-21 MED ORDER — PROPOFOL 10 MG/ML IV BOLUS
INTRAVENOUS | Status: AC
  Filled 2020-03-21: qty 20

## 2020-03-21 MED ORDER — PANTOPRAZOLE SODIUM 40 MG PO TBEC
40.0000 mg | DELAYED_RELEASE_TABLET | Freq: Every day | ORAL | Status: DC
  Administered 2020-03-21 – 2020-03-22 (×2): 40 mg via ORAL
  Filled 2020-03-21 (×2): qty 1

## 2020-03-21 MED ORDER — OXYCODONE HCL 5 MG/5ML PO SOLN: 5.0000 mg | Freq: Once | ORAL | Status: DC | PRN

## 2020-03-21 MED ORDER — FENTANYL CITRATE (PF) 100 MCG/2ML IJ SOLN: 25.0000 ug | INTRAMUSCULAR | Status: DC | PRN

## 2020-03-21 MED ORDER — 0.9 % SODIUM CHLORIDE (POUR BTL) OPTIME
TOPICAL | Status: DC | PRN
  Administered 2020-03-21: 1000 mL

## 2020-03-21 MED ORDER — GABAPENTIN 300 MG PO CAPS
300.0000 mg | ORAL_CAPSULE | Freq: Four times a day (QID) | ORAL | Status: DC
  Administered 2020-03-21 – 2020-03-22 (×5): 300 mg via ORAL
  Filled 2020-03-21 (×5): qty 1

## 2020-03-21 MED ORDER — KETOROLAC TROMETHAMINE 15 MG/ML IJ SOLN
15.0000 mg | Freq: Four times a day (QID) | INTRAMUSCULAR | Status: DC
  Administered 2020-03-21 – 2020-03-22 (×3): 15 mg via INTRAVENOUS
  Filled 2020-03-21 (×3): qty 1

## 2020-03-21 MED ORDER — TRANEXAMIC ACID-NACL 1000-0.7 MG/100ML-% IV SOLN
1000.0000 mg | INTRAVENOUS | Status: DC
  Filled 2020-03-21: qty 100

## 2020-03-21 MED ORDER — ROPINIROLE HCL 1 MG PO TABS
1.0000 mg | ORAL_TABLET | Freq: Every day | ORAL | Status: DC
  Administered 2020-03-21: 1 mg via ORAL
  Filled 2020-03-21: qty 1

## 2020-03-21 MED ORDER — ONDANSETRON HCL 4 MG/2ML IJ SOLN
INTRAMUSCULAR | Status: DC | PRN
  Administered 2020-03-21: 4 mg via INTRAVENOUS

## 2020-03-21 MED ORDER — PROMETHAZINE HCL 25 MG PO TABS: 25.0000 mg | ORAL_TABLET | Freq: Four times a day (QID) | ORAL | Status: DC | PRN

## 2020-03-21 MED ORDER — CEFAZOLIN SODIUM-DEXTROSE 2-4 GM/100ML-% IV SOLN
2.0000 g | INTRAVENOUS | Status: AC
  Administered 2020-03-21: 2 g via INTRAVENOUS

## 2020-03-21 MED ORDER — METOCLOPRAMIDE HCL 5 MG PO TABS: 5.0000 mg | ORAL_TABLET | Freq: Three times a day (TID) | ORAL | Status: DC | PRN

## 2020-03-21 MED ORDER — PHENTERMINE HCL 30 MG PO CAPS: 30.0000 mg | ORAL_CAPSULE | Freq: Every day | ORAL | Status: DC

## 2020-03-21 MED ORDER — FENTANYL CITRATE (PF) 250 MCG/5ML IJ SOLN
INTRAMUSCULAR | Status: AC
  Filled 2020-03-21: qty 5

## 2020-03-21 MED ORDER — VITAMIN D 25 MCG (1000 UNIT) PO TABS
2000.0000 [IU] | ORAL_TABLET | Freq: Every day | ORAL | Status: DC
  Administered 2020-03-21 – 2020-03-22 (×2): 2000 [IU] via ORAL
  Filled 2020-03-21 (×2): qty 2

## 2020-03-21 MED ORDER — LACTATED RINGERS IV SOLN: INTRAVENOUS | Status: DC

## 2020-03-21 MED ORDER — MENTHOL 3 MG MT LOZG: 1.0000 | LOZENGE | OROMUCOSAL | Status: DC | PRN

## 2020-03-21 MED ORDER — METHOCARBAMOL 1000 MG/10ML IJ SOLN
500.0000 mg | Freq: Four times a day (QID) | INTRAVENOUS | Status: DC | PRN
  Filled 2020-03-21: qty 5

## 2020-03-21 MED ORDER — SODIUM CHLORIDE 0.9 % IV SOLN: INTRAVENOUS | Status: DC

## 2020-03-21 MED ORDER — PHENOL 1.4 % MT LIQD: 1.0000 | OROMUCOSAL | Status: DC | PRN

## 2020-03-21 MED ORDER — ATORVASTATIN CALCIUM 10 MG PO TABS
20.0000 mg | ORAL_TABLET | Freq: Every day | ORAL | Status: DC
  Administered 2020-03-21 – 2020-03-22 (×2): 20 mg via ORAL
  Filled 2020-03-21 (×2): qty 2

## 2020-03-21 MED ORDER — CEFAZOLIN SODIUM-DEXTROSE 2-4 GM/100ML-% IV SOLN
INTRAVENOUS | Status: AC
  Filled 2020-03-21: qty 100

## 2020-03-21 MED ORDER — OXYCODONE HCL 5 MG PO TABS
5.0000 mg | ORAL_TABLET | ORAL | Status: DC | PRN
  Administered 2020-03-21 – 2020-03-22 (×2): 10 mg via ORAL
  Filled 2020-03-21 (×2): qty 2

## 2020-03-21 MED ORDER — DIPHENHYDRAMINE HCL 12.5 MG/5ML PO ELIX: 12.5000 mg | ORAL_SOLUTION | ORAL | Status: DC | PRN

## 2020-03-21 MED ORDER — METHOCARBAMOL 500 MG PO TABS: 500.0000 mg | ORAL_TABLET | Freq: Four times a day (QID) | ORAL | Status: DC | PRN

## 2020-03-21 MED ORDER — CHLORHEXIDINE GLUCONATE 0.12 % MT SOLN: 15.0000 mL | Freq: Once | OROMUCOSAL | Status: DC

## 2020-03-21 MED ORDER — OXYCODONE HCL 5 MG PO TABS: 5.0000 mg | ORAL_TABLET | Freq: Once | ORAL | Status: DC | PRN

## 2020-03-21 MED ORDER — ZOLPIDEM TARTRATE 5 MG PO TABS: 5.0000 mg | ORAL_TABLET | Freq: Every evening | ORAL | Status: DC | PRN

## 2020-03-21 MED ORDER — CHLORHEXIDINE GLUCONATE 0.12 % MT SOLN
OROMUCOSAL | Status: AC
  Filled 2020-03-21: qty 15

## 2020-03-21 MED ORDER — IRBESARTAN 150 MG PO TABS
150.0000 mg | ORAL_TABLET | Freq: Every day | ORAL | Status: DC
  Administered 2020-03-21 – 2020-03-22 (×2): 150 mg via ORAL
  Filled 2020-03-21 (×2): qty 1

## 2020-03-21 MED ORDER — CEFAZOLIN SODIUM-DEXTROSE 1-4 GM/50ML-% IV SOLN
1.0000 g | Freq: Four times a day (QID) | INTRAVENOUS | Status: DC
  Filled 2020-03-21 (×2): qty 50

## 2020-03-21 MED ORDER — LIDOCAINE 2% (20 MG/ML) 5 ML SYRINGE
INTRAMUSCULAR | Status: AC
  Filled 2020-03-21: qty 5

## 2020-03-21 MED ORDER — FENTANYL CITRATE (PF) 250 MCG/5ML IJ SOLN
INTRAMUSCULAR | Status: DC | PRN
Start: 2020-03-21 — End: 2020-03-21
  Administered 2020-03-21: 50 ug via INTRAVENOUS

## 2020-03-21 MED ORDER — LIDOCAINE 2% (20 MG/ML) 5 ML SYRINGE
INTRAMUSCULAR | Status: DC | PRN
  Administered 2020-03-21: 20 mg via INTRAVENOUS

## 2020-03-21 MED ORDER — POVIDONE-IODINE 10 % EX SWAB: 2.0000 "application " | Freq: Once | CUTANEOUS | Status: DC

## 2020-03-21 MED ORDER — MIDAZOLAM HCL 2 MG/2ML IJ SOLN
INTRAMUSCULAR | Status: AC
  Filled 2020-03-21: qty 2

## 2020-03-21 SURGICAL SUPPLY — 62 items
ACETAB CUP W GRIPTION 54MM (Plate) ×1 IMPLANT
ACETAB CUP W/GRIPTION 54 (Plate) ×2 IMPLANT
ARTICULEZE HEAD (Hips) IMPLANT
BENZOIN TINCTURE PRP APPL 2/3 (GAUZE/BANDAGES/DRESSINGS) ×3 IMPLANT
BLADE CLIPPER SURG (BLADE) IMPLANT
BLADE SAW SGTL 18X1.27X75 (BLADE) ×2 IMPLANT
BLADE SAW SGTL 18X1.27X75MM (BLADE) ×1
CLOSURE WOUND 1/2 X4 (GAUZE/BANDAGES/DRESSINGS) ×2
COLLAR OFFSET CORAIL SZ 11 HIP (Stem) ×1 IMPLANT
CORAIL OFFSET COLLAR SZ 11 HIP (Stem) ×3 IMPLANT
COVER SURGICAL LIGHT HANDLE (MISCELLANEOUS) ×3 IMPLANT
CUP ACETAB W/GRIPTION 54 (Plate) ×1 IMPLANT
DRAPE C-ARM 42X72 X-RAY (DRAPES) ×3 IMPLANT
DRAPE STERI IOBAN 125X83 (DRAPES) ×3 IMPLANT
DRAPE U-SHAPE 47X51 STRL (DRAPES) ×9 IMPLANT
DRSG AQUACEL AG ADV 3.5X10 (GAUZE/BANDAGES/DRESSINGS) ×3 IMPLANT
DURAPREP 26ML APPLICATOR (WOUND CARE) ×3 IMPLANT
ELECT BLADE 4.0 EZ CLEAN MEGAD (MISCELLANEOUS) ×3
ELECT BLADE 6.5 EXT (BLADE) IMPLANT
ELECT REM PT RETURN 9FT ADLT (ELECTROSURGICAL) ×3
ELECTRODE BLDE 4.0 EZ CLN MEGD (MISCELLANEOUS) ×1 IMPLANT
ELECTRODE REM PT RTRN 9FT ADLT (ELECTROSURGICAL) ×1 IMPLANT
FACESHIELD WRAPAROUND (MASK) ×6 IMPLANT
GAUZE XEROFORM 5X9 LF (GAUZE/BANDAGES/DRESSINGS) ×3 IMPLANT
GLOVE BIOGEL PI IND STRL 8 (GLOVE) ×2 IMPLANT
GLOVE BIOGEL PI INDICATOR 8 (GLOVE) ×4
GLOVE ECLIPSE 8.0 STRL XLNG CF (GLOVE) ×3 IMPLANT
GLOVE ORTHO TXT STRL SZ7.5 (GLOVE) ×6 IMPLANT
GOWN STRL REUS W/ TWL LRG LVL3 (GOWN DISPOSABLE) ×2 IMPLANT
GOWN STRL REUS W/ TWL XL LVL3 (GOWN DISPOSABLE) ×2 IMPLANT
GOWN STRL REUS W/TWL LRG LVL3 (GOWN DISPOSABLE) ×4
GOWN STRL REUS W/TWL XL LVL3 (GOWN DISPOSABLE) ×4
HANDPIECE INTERPULSE COAX TIP (DISPOSABLE) ×2
HEAD ARTICULEZE (Hips) IMPLANT
HEAD M SROM 36MM PLUS 1.5 (Hips) ×1 IMPLANT
KIT BASIN OR (CUSTOM PROCEDURE TRAY) ×3 IMPLANT
KIT TURNOVER KIT B (KITS) ×3 IMPLANT
LINER NEUTRAL 54X36MM PLUS 4 (Hips) ×3 IMPLANT
MANIFOLD NEPTUNE II (INSTRUMENTS) ×3 IMPLANT
NS IRRIG 1000ML POUR BTL (IV SOLUTION) ×3 IMPLANT
PACK TOTAL JOINT (CUSTOM PROCEDURE TRAY) ×3 IMPLANT
PAD ARMBOARD 7.5X6 YLW CONV (MISCELLANEOUS) ×3 IMPLANT
SCREW 6.5MMX25MM (Screw) ×6 IMPLANT
SET HNDPC FAN SPRY TIP SCT (DISPOSABLE) ×1 IMPLANT
SROM M HEAD 36MM PLUS 1.5 (Hips) ×3 IMPLANT
STAPLER VISISTAT 35W (STAPLE) IMPLANT
STRIP CLOSURE SKIN 1/2X4 (GAUZE/BANDAGES/DRESSINGS) ×4 IMPLANT
SUT ETHIBOND NAB CT1 #1 30IN (SUTURE) ×3 IMPLANT
SUT ETHILON 2 0 PSLX (SUTURE) ×6 IMPLANT
SUT MNCRL AB 4-0 PS2 18 (SUTURE) IMPLANT
SUT VIC AB 0 CT1 27 (SUTURE) ×2
SUT VIC AB 0 CT1 27XBRD ANBCTR (SUTURE) ×1 IMPLANT
SUT VIC AB 1 CT1 27 (SUTURE) ×2
SUT VIC AB 1 CT1 27XBRD ANBCTR (SUTURE) ×1 IMPLANT
SUT VIC AB 2-0 CT1 27 (SUTURE) ×2
SUT VIC AB 2-0 CT1 TAPERPNT 27 (SUTURE) ×1 IMPLANT
TOWEL GREEN STERILE (TOWEL DISPOSABLE) ×3 IMPLANT
TOWEL GREEN STERILE FF (TOWEL DISPOSABLE) ×3 IMPLANT
TRAY CATH 16FR W/PLASTIC CATH (SET/KITS/TRAYS/PACK) IMPLANT
TRAY FOLEY W/BAG SLVR 16FR (SET/KITS/TRAYS/PACK)
TRAY FOLEY W/BAG SLVR 16FR ST (SET/KITS/TRAYS/PACK) IMPLANT
WATER STERILE IRR 1000ML POUR (IV SOLUTION) ×6 IMPLANT

## 2020-03-21 NOTE — Discharge Instructions (Signed)

## 2020-03-21 NOTE — Brief Op Note (Signed)
03/21/2020  11:06 AM  PATIENT:  Monica Serrano  69 y.o. female  PRE-OPERATIVE DIAGNOSIS:  osteoarthritis right hip  POST-OPERATIVE DIAGNOSIS:  osteoarthritis right hip  PROCEDURE:  Procedure(s): RIGHT TOTAL HIP ARTHROPLASTY ANTERIOR APPROACH (Right)  SURGEON:  Surgeon(s) and Role:    Kathryne Hitch, MD - Primary  PHYSICIAN ASSISTANT:   Rexene Edison, PA-C  ANESTHESIA:   spinal  EBL:  250 mL   COUNTS:  YES  DICTATION: .Other Dictation: Dictation Number [id number]  PLAN OF CARE: Admit for overnight observation  PATIENT DISPOSITION:  PACU - hemodynamically stable.   Delay start of Pharmacological VTE agent (>24hrs) due to surgical blood loss or risk of bleeding: no

## 2020-03-21 NOTE — TOC Initial Note (Signed)
Transition of Care [facility]) - Initial/Assessment Note    Patient Details  Name: Monica Serrano MRN: [id number] Date of Birth: April 13, 1951  Transition of Care [facility]) CM/SW Contact:    Kingsley Plan, RN Phone Number: 03/21/2020, 4:54 PM  Clinical Narrative:                 Confirmed face sheet information. Patient from home alone but has arranged help for when she first goes home. Patient has walker at home already. Ordered 3 in1 with [facility] asked them to bring to room today .  Surgery office had pre arranged home health with [facility], called Kathlene November with [facility] they do not service [place]. PAtient has had [facility] and would like them back. Referral given to and accepted by Amy and Debbie at [facility].  Expected Discharge Plan: Home w Home Health Services Barriers to Discharge: Continued Medical Work up   Patient Goals and CMS Choice Patient states their goals for this hospitalization and ongoing recovery are:: to return tohome CMS Medicare.gov Compare Post Acute Care list provided to:: Patient Choice offered to / list presented to : Patient  Expected Discharge Plan and Services Expected Discharge Plan: Home w Home Health Services   Discharge Planning Services: CM Consult Post Acute Care Choice: Home Health Living arrangements for the past 2 months: Single Family Home                 DME Arranged: 3-N-1 DME Agency: [facility] Date DME Agency Contacted: 03/21/20 Time DME Agency Contacted: [phone] Representative spoke with at DME Agency: Velna Hatchet HH Arranged: PT HH Agency: [facility] Date [facility] Agency Contacted: 03/21/20 Time HH Agency Contacted: 1653 Representative spoke with at [facility] Agency: Eunice Blase and Amy  Prior Living Arrangements/Services Living arrangements for the past 2 months: Single Family Home Lives with:: Self Patient language and need for interpreter reviewed:: Yes Do you feel safe going back to the place where you live?: Yes       Need for Family Participation in Patient Care: Yes (Comment) Care giver support system in place?: Yes (comment) Current home services: DME Criminal Activity/Legal Involvement Pertinent to Current Situation/Hospitalization: No - Comment as needed  Activities of Daily Living Home Assistive Devices/Equipment: Other (Comment) (Wave Machine -for lymphedema and support hose)    Permission Sought/Granted   Permission granted to share information with : Yes, Verbal Permission Granted     Permission granted to share info w AGENCY: [facility]        Emotional Assessment Appearance:: Appears stated age Attitude/Demeanor/Rapport: Engaged Affect (typically observed): Accepting Orientation: : Oriented to Self, Oriented to Place, Oriented to  Time, Oriented to Situation Alcohol / Substance Use: Not Applicable Psych Involvement: No (comment)  Admission diagnosis:  Status post total replacement of right hip [Z96.641] Patient Active Problem List   Diagnosis Date Noted  . Status post total replacement of right hip 03/21/2020  . Unilateral primary osteoarthritis, right hip 01/09/2020   PCP:  Eulis Foster, MD Pharmacy:   [facility] - [place], [place] - [address] [facility] [address] [place] [address] Phone: [phone] Fax: [phone]     Social Determinants of Health (SDOH) Interventions    Readmission Risk Interventions No flowsheet data found.

## 2020-03-21 NOTE — H&P (Signed)
TOTAL HIP ADMISSION H&P  Patient is admitted for right total hip arthroplasty.  Subjective:  Chief Complaint: right hip pain  HPI: Monica Serrano, 69 y.o. female, has a history of pain and functional disability in the right hip(s) due to arthritis and patient has failed non-surgical conservative treatments for greater than 12 weeks to include NSAID's and/or analgesics, corticosteriod injections, flexibility and strengthening excercises, use of assistive devices, weight reduction as appropriate and activity modification.  Onset of symptoms was gradual starting 3 years ago with gradually worsening course since that time.The patient noted no past surgery on the right hip(s).  Patient currently rates pain in the right hip at 10 out of 10 with activity. Patient has night pain, worsening of pain with activity and weight bearing, trendelenberg gait, pain that interfers with activities of daily living and pain with passive range of motion. Patient has evidence of subchondral cysts, subchondral sclerosis, periarticular osteophytes and joint space narrowing by imaging studies. This condition presents safety issues increasing the risk of falls.  There is no current active infection.  Patient Active Problem List   Diagnosis Date Noted  . Unilateral primary osteoarthritis, right hip 01/09/2020   Past Medical History:  Diagnosis Date  . Anxiety   . CKD (chronic kidney disease)   . Depression   . DVT, lower extremity (HCC) 2019   right leg  . Headache    Migraines - years ago  . History of kidney stones    passed  . Hyperlipemia   . Hypertension   . Lymphedema   . Osteoarthritis   . Sleep apnea   . Vertigo     Past Surgical History:  Procedure Laterality Date  . BREAST SURGERY Right    Biospy  . EYE SURGERY Bilateral    Catartact  . JOINT REPLACEMENT     knees    Current Facility-Administered Medications  Medication Dose Route Frequency Provider Last Rate Last Admin  . ceFAZolin (ANCEF)  2-4 GM/100ML-% IVPB           . ceFAZolin (ANCEF) IVPB 2g/100 mL premix  2 g Intravenous On Call to OR Kirtland Bouchard, PA-C      . chlorhexidine (PERIDEX) 0.12 % solution 15 mL  15 mL Mouth/Throat Once Shelton Silvas, MD       Or  . MEDLINE mouth rinse  15 mL Mouth Rinse Once Shelton Silvas, MD      . chlorhexidine (PERIDEX) 0.12 % solution           . lactated ringers infusion   Intravenous Continuous Shelton Silvas, MD      . povidone-iodine 10 % swab 2 application  2 application Topical Once Richardean Canal W, PA-C      . tranexamic acid (CYKLOKAPRON) IVPB 1,000 mg  1,000 mg Intravenous To OR Kirtland Bouchard, PA-C       No Known Allergies  Social History   Tobacco Use  . Smoking status: Former Smoker    Years: 10.00    Types: Cigarettes    Quit date: 1988    Years since quitting: 33.8  . Smokeless tobacco: Never Used  Substance Use Topics  . Alcohol use: Not Currently    History reviewed. No pertinent family history.   Review of Systems  Musculoskeletal: Positive for gait problem.  All other systems reviewed and are negative.   Objective:  Physical Exam Vitals reviewed.  Constitutional:      Appearance: Normal appearance.  Eyes:  Extraocular Movements: Extraocular movements intact.     Pupils: Pupils are equal, round, and reactive to light.  Cardiovascular:     Rate and Rhythm: Normal rate and regular rhythm.     Pulses: Normal pulses.  Pulmonary:     Effort: Pulmonary effort is normal.     Breath sounds: Normal breath sounds.  Abdominal:     Palpations: Abdomen is soft.  Musculoskeletal:     Cervical back: Normal range of motion.     Right hip: Tenderness and bony tenderness present. Decreased range of motion. Decreased strength.  Neurological:     Mental Status: She is alert and oriented to person, place, and time.  Psychiatric:        Behavior: Behavior normal.     Vital signs in last 24 hours: Temp:  [98.4 F (36.9 C)] 98.4 F (36.9 C) (10/15  0803) Pulse Rate:  [81] 81 (10/15 0803) Resp:  [18] 18 (10/15 0803) BP: (140)/(64) 140/64 (10/15 0803) SpO2:  [96 %] 96 % (10/15 0803) Weight:  [103.3 kg] 103.3 kg (10/15 0803)  Labs:   Estimated body mass index is 37.9 kg/m as calculated from the following:   Height as of this encounter: 5\' 5"  (1.651 m).   Weight as of this encounter: 103.3 kg.   Imaging Review Plain radiographs demonstrate severe degenerative joint disease of the right hip(s). The bone quality appears to be good for age and reported activity level.      Assessment/Plan:  End stage arthritis, right hip(s)  The patient history, physical examination, clinical judgement of the provider and imaging studies are consistent with end stage degenerative joint disease of the right hip(s) and total hip arthroplasty is deemed medically necessary. The treatment options including medical management, injection therapy, arthroscopy and arthroplasty were discussed at length. The risks and benefits of total hip arthroplasty were presented and reviewed. The risks due to aseptic loosening, infection, stiffness, dislocation/subluxation,  thromboembolic complications and other imponderables were discussed.  The patient acknowledged the explanation, agreed to proceed with the plan and consent was signed. Patient is being admitted for inpatient treatment for surgery, pain control, PT, OT, prophylactic antibiotics, VTE prophylaxis, progressive ambulation and ADL's and discharge planning.The patient is planning to be discharged home with home health services

## 2020-03-21 NOTE — Progress Notes (Signed)
Orthopedic Tech Progress Note Patient Details:  Monica Serrano 07-22-1950 [id number]      Post Interventions Patient Tolerated: Well Instructions Provided: Poper ambulation with device   Alvetta Hidrogo A Soniya Ashraf 03/21/2020, 5:54 PM

## 2020-03-21 NOTE — Transfer of Care (Signed)
Immediate Anesthesia Transfer of Care Note  Patient: Monica Serrano  Procedure(s) Performed: RIGHT TOTAL HIP ARTHROPLASTY ANTERIOR APPROACH (Right Hip)  Patient Location: PACU  Anesthesia Type:MAC and Spinal  Level of Consciousness: awake  Airway & Oxygen Therapy: Patient Spontanous Breathing and Patient connected to face mask oxygen  Post-op Assessment: Report given to RN  Post vital signs: Reviewed and stable  Last Vitals:  Vitals Value Taken Time  BP    Temp    Pulse 73 03/21/20 1135  Resp 10 03/21/20 1135  SpO2 98 % 03/21/20 1135  Vitals shown include unvalidated device data.  Last Pain:  Vitals:   03/21/20 0803  TempSrc: Oral  PainSc:       Patients Stated Pain Goal: 2 (66/06/30 1601)  Complications: No complications documented.

## 2020-03-21 NOTE — Progress Notes (Signed)
PHARMACY NOTE:  ANTIMICROBIAL RENAL DOSAGE ADJUSTMENT  Current antimicrobial regimen includes a mismatch between antimicrobial dosage and estimated renal function.  As per policy approved by the Pharmacy & Therapeutics and Medical Executive Committees, the antimicrobial dosage will be adjusted accordingly.  Current antimicrobial dosage:  Cefazolin 2g q6 hr x2 doses  Indication: surgical ppx  Renal Function:  Estimated Creatinine Clearance: 31.2 mL/min (A) (by C-G formula based on SCr of 2.03 mg/dL (H)).    Antimicrobial dosage has been changed to:  Cefazolin q12 hr x1 dose   Thank you for allowing pharmacy to be a part of this patient's care.  Alphia Moh, PharmD, BCPS, BCCP Clinical Pharmacist  Please check AMION for all [facility] Pharmacy phone numbers After 10:00 PM, call Main Pharmacy [phone]

## 2020-03-21 NOTE — Evaluation (Signed)
Physical Therapy Evaluation Patient Details Name: Monica Serrano MRN: [id number] DOB: 1950/08/18 Today's Date: 03/21/2020   History of Present Illness  Patient is 69 y/o female with PMH of CKD, DVT, lymphedema, HTN, OA, sleep apnea, and vertigo. Patient underwent R THA on 10/15 due to longstanding and worsening pain in right hip starting 3 years ago. Patient is currently WBAT.     Clinical Impression  Prior to surgery, patient lives alone and utilized RW for mobility. Patient ambulated 16' total fwd/bwd with RW and min guard, intermittent bouts of shooting pain from R hip. Instructed patient on hip precautions and provided handout. Educated patient on HEP and provided handout. Patient is limited by pain, decreased activity tolerance, decreased B LE strength, and impaired functional mobility. Patient is also limited by fear, due to history of R knee popping and pain with ambulation, however no popping noted this session. Will benefit from skilled PT services during acute stay to address listed deficits. Recommend HHPT following discharge to maximize functional mobility.     Follow Up Recommendations Home health PT;Supervision for mobility/OOB    Equipment Recommendations  3in1 (PT)    Recommendations for Other Services OT consult     Precautions / Restrictions Precautions Precautions: Anterior Hip;Fall Precaution Booklet Issued: Yes (comment) Precaution Comments: Educated and demonstrated precautions Restrictions Weight Bearing Restrictions: Yes RLE Weight Bearing: Weight bearing as tolerated      Mobility  Bed Mobility Overal bed mobility: Needs Assistance Bed Mobility: Supine to Sit;Sit to Supine     Supine to sit: Min guard Sit to supine: Min assist   General bed mobility comments: Patient required minA for sit to supine for R LE advancement onto bed  Transfers Overall transfer level: Needs assistance Equipment used: Rolling Kaye Luoma (2 wheeled) Transfers: Sit to/from  Stand Sit to Stand: Min assist            Ambulation/Gait Ambulation/Gait assistance: Min guard Gait Distance (Feet): 16 Feet Assistive device: Rolling Kiaria Quinnell (2 wheeled) Gait Pattern/deviations: Step-to pattern;Decreased stance time - right;Wide base of support;Trunk flexed Gait velocity: decreased   General Gait Details: pt able to amb fwd and bwd with RW and min guard, intermittent bouts of shooting pain throughout  Stairs            Wheelchair Mobility    Modified Rankin (Stroke Patients Only)       Balance Overall balance assessment: Needs assistance Sitting-balance support: Feet supported;Bilateral upper extremity supported Sitting balance-Leahy Scale: Poor     Standing balance support: Bilateral upper extremity supported;During functional activity Standing balance-Leahy Scale: Fair                               Pertinent Vitals/Pain Pain Assessment: 0-10 Pain Score: 7  Pain Location: R hip Pain Descriptors / Indicators: Sore;Shooting;Grimacing;Guarding;Aching Pain Intervention(s): Limited activity within patient's tolerance;Monitored during session    Home Living Family/patient expects to be discharged to:: Private residence Living Arrangements: Alone Available Help at Discharge: Family;Available PRN/intermittently Type of Home: House Home Access: Ramped entrance     Home Layout: One level Home Equipment: Elivia Robotham - 2 wheels;Shower seat - built in;Hand held shower head Additional Comments: Patient was using RW prior to surgery due to R hip/knee pain    Prior Function Level of Independence: Independent with assistive device(s)         Comments: Patient was using RW for ambulation due to pain     Hand Dominance  Extremity/Trunk Assessment   Upper Extremity Assessment Upper Extremity Assessment: Overall WFL for tasks assessed    Lower Extremity Assessment Lower Extremity Assessment: RLE deficits/detail RLE Deficits  / Details: generalized weakness due to surgery       Communication   Communication: HOH  Cognition Arousal/Alertness: Awake/alert Behavior During Therapy: WFL for tasks assessed/performed Overall Cognitive Status: Within Functional Limits for tasks assessed                                        General Comments General comments (skin integrity, edema, etc.): Pt informed PT about hx of knee popping on R side with every step she takes. She is anxious about ambulating due to pain with popping. No popping noted with ambulation this session.     Exercises     Assessment/Plan    PT Assessment Patient needs continued PT services  PT Problem List Decreased strength;Decreased range of motion;Decreased activity tolerance;Decreased balance;Decreased mobility;Decreased knowledge of precautions;Pain       PT Treatment Interventions Gait training;Functional mobility training;Therapeutic activities;Therapeutic exercise;Balance training;Neuromuscular re-education;Patient/family education    PT Goals (Current goals can be found in the Care Plan section)  Acute Rehab PT Goals Patient Stated Goal: to be able to walk better with less pain PT Goal Formulation: With patient Time For Goal Achievement: 04/04/20 Potential to Achieve Goals: Good    Frequency 7X/week (BID)   Barriers to discharge        Co-evaluation               AM-PAC PT "6 Clicks" Mobility  Outcome Measure Help needed turning from your back to your side while in a flat bed without using bedrails?: A Little Help needed moving from lying on your back to sitting on the side of a flat bed without using bedrails?: A Little Help needed moving to and from a bed to a chair (including a wheelchair)?: A Little Help needed standing up from a chair using your arms (e.g., wheelchair or bedside chair)?: A Little Help needed to walk in hospital room?: A Little Help needed climbing 3-5 steps with a railing? : A  Lot 6 Click Score: 17    End of Session Equipment Utilized During Treatment: Gait belt Activity Tolerance: Patient limited by pain Patient left: in bed;with call bell/phone within reach;with nursing/sitter in room;with family/visitor present Nurse Communication: Mobility status PT Visit Diagnosis: Unsteadiness on feet (R26.81);Other abnormalities of gait and mobility (R26.89);Muscle weakness (generalized) (M62.81);Difficulty in walking, not elsewhere classified (R26.2);Pain Pain - Right/Left: Right Pain - part of body: Hip    Time: 1420-1504 PT Time Calculation (min) (ACUTE ONLY): 44 min   Charges:   PT Evaluation $PT Eval Moderate Complexity: 1 Mod PT Treatments $Therapeutic Exercise: 23-37 mins        Gregor Hams, PT, DPT Acute Rehabilitation Services Pager [phone] Office [phone]   Zannie Kehr Allred 03/21/2020, 3:38 PM

## 2020-03-21 NOTE — Anesthesia Procedure Notes (Signed)
Spinal  Patient location during procedure: OR Start time: 03/21/2020 9:43 AM End time: 03/21/2020 9:45 AM Staffing Performed: anesthesiologist  Anesthesiologist: Achille Rich, MD Preanesthetic Checklist Completed: patient identified, IV checked, risks and benefits discussed, surgical consent, monitors and equipment checked, pre-op evaluation and timeout performed Spinal Block Patient position: sitting Prep: DuraPrep Patient monitoring: cardiac monitor, continuous pulse ox and blood pressure Approach: midline Location: L3-4 Injection technique: single-shot Needle Needle type: Pencan  Needle gauge: 24 G Needle length: 9 cm Assessment Sensory level: T10 Additional Notes Functioning IV was confirmed and monitors were applied. Sterile prep and drape, including hand hygiene and sterile gloves were used. The patient was positioned and the spine was prepped. The skin was anesthetized with lidocaine.  Free flow of clear CSF was obtained prior to injecting local anesthetic into the CSF.  The spinal needle aspirated freely following injection.  The needle was carefully withdrawn.  The patient tolerated the procedure well.

## 2020-03-21 NOTE — Op Note (Signed)
NAMELEYLANIE, WOODMANSEE MEDICAL RECORD JJ:[id number] ACCOUNT [id number] DATE OF BIRTH:04/28/1951 FACILITY: [facility] LOCATION: [facility] PHYSICIAN:Kiana Hollar Aretha Parrot, MD  OPERATIVE REPORT  DATE OF PROCEDURE:  03/21/2020  PREOPERATIVE DIAGNOSIS:  Primary osteoarthritis and degenerative joint disease, right hip.  POSTOPERATIVE DIAGNOSIS:  Primary osteoarthritis and degenerative joint disease, right hip.  PROCEDURE:  Right total hip arthroplasty through direct anterior approach.  IMPLANTS:  DePuy Sector Gription acetabular component size 54 with 2 screws, size 36+4 neutral polyethylene liner, size 11 Corail femoral component with high offset, size 36+1.5 metal hip ball.  SURGEON:  Vanita Panda. Magnus Ivan, MD  ASSISTANT:  Richardean Canal, PA-C.  ANESTHESIA:  Spinal.  ANTIBIOTICS:  Two g IV Ancef.  ESTIMATED BLOOD LOSS:  250-300 mL.   COMPLICATIONS:  None.  INDICATIONS:  The patient is a 69 year old female with debilitating arthritis involving her right hip.  Her x-rays show complete flattening of the femoral head and subluxation of the joint.  She has a significant leg length discrepancy with the right  side shorter than the left.  She does have a BMI of close to 40.  Her thigh is very large as well.  We did recommend total hip arthroplasty through direct anterior approach, but she understands fully that this case will be much more difficult due to her  morbid obesity.  There is a heightened risk of acute blood loss anemia, nerve or vessel injury, fracture, infection, dislocation, DVT and implant failure and especially skin and soft tissue issues.  We talked about the goals being hopefully to decrease  pain, improve mobility and overall improve quality of life.  DESCRIPTION OF PROCEDURE:  After informed consent was obtained, the appropriate right hip was marked.  She was brought to the operating room and sat up on a stretcher where spinal anesthesia was obtained.  She was laid in the supine  position on a  stretcher.  Foley catheter was placed and traction boots were placed on both her feet.  Next, she was placed supine on the Hana fracture table with the perineal post in place and both legs in line skeletal traction device and no traction applied.  Of  note, she was significantly short preoperatively with her lying supine with that right leg comparing the right and left.  We then prepped and draped the right hip with DuraPrep and sterile drapes.  We also had to tape the pannus to pull this away from  the hip.  A timeout was called to identify correct patient, correct right hip.  We then made an incision just inferior and posterior to the anterior superior iliac spine and carried this obliquely down the leg.  We dissected down to the tensor fascia  lata muscle.  Tensor fascia was then divided longitudinally to proceed with direct anterior approach to the hip.  We identified and cauterized circumflex vessels and identified the hip capsule, opened up the hip capsule in an L-type format, finding a  moderate joint effusion and significant disease around the femoral head and neck.  We then placed Cobra retractors within the medial and lateral joint capsule and made our femoral neck cut with an oscillating saw just proximal to the lesser trochanter  and completed this with an osteotome.  We placed a corkscrew guide in the femoral head and removed the femoral head in its entirety and found it to be completely deformed and flattened.  We then placed a bent Hohmann over the medial acetabular rim and  removed remnants of the acetabular labrum and other  debris.  She had started to create a pseudoacetabulum.  We reamed in sequential increments from a size 43 reamer, going up to a size 53, with all reamers under direct visualization, the last reamer  under direct fluoroscopy, so we could really obtain our depth of reaming, our inclination and anteversion.  I then placed the real DePuy Sector Gription  acetabular component size 54 and I went with 2 screws and a 36+4 liner based on with her offset had  been a preoperative and then placing the acetabular component in place.   Attention was then turned to the femur.  With the leg externally rotated to 120 degrees, extended and adducted, we were to place a Mueller retractor medially and a Hohmann  retractor above the greater trochanter.  We released the lateral joint capsule and used a box-cutting osteotome to enter the femoral canal and a rongeur to lateralize.  We then began broaching using the Corail broaching system from a size 8 going up to a  size 11.  With a size 11 in place, we trialed a standard offset femoral neck and a 36 minus 2 hip ball since she has been significantly short before which brought down her acetabulum.  We reduced this in the acetabulum and it was into the pelvis and it  was tight and she was still short, but it was certainly tight, but we felt like we could go more leg lengthwise and more offset.  We dislocated the hip and removed the trial components.  We went with the real Corail femoral component size 11, but with a  high offset component, went with a real 36+5 metal hip ball and tried to reduce this in the acetabulum.  It was obviously too long with too much offset, so we had to remove that ball and went with a 36+1.5 metal hip ball and we were able to tightly  reduced that into the acetabulum.  This did bring her leg lengths and offset closer together and she was stable on clinical and radiographic assessment.  We then irrigated the soft tissue with normal saline solution using pulsatile lavage.  We  reapproximated the joint capsule with interrupted #1 Ethibond suture, followed by closing the tensor fascia with #1 Vicryl.  Zero Vicryl was used to close deep tissue and 2-0 Vicryl was used to close subcutaneous tissue.  The skin was reapproximated with  2-0 nylon sutures.  Xeroform and Aquacel dressing was applied.  She was taken  off the Hana table and taken to recovery room in stable condition with all final counts being correct and no complications noted.  Of note, Rexene Edison, PA-C did assist during  the entire case and his assistance was crucial for facilitating every single aspect of this case.  VN/NUANCE  D:03/21/2020 T:03/21/2020 JOB:[id number]

## 2020-03-21 NOTE — Anesthesia Postprocedure Evaluation (Signed)
Anesthesia Post Note  Patient: Monica Serrano  Procedure(s) Performed: RIGHT TOTAL HIP ARTHROPLASTY ANTERIOR APPROACH (Right Hip)     Patient location during evaluation: PACU Anesthesia Type: MAC and Spinal Level of consciousness: awake and alert Pain management: pain level controlled Vital Signs Assessment: post-procedure vital signs reviewed and stable Respiratory status: spontaneous breathing, nonlabored ventilation, respiratory function stable and patient connected to nasal cannula oxygen Cardiovascular status: stable and blood pressure returned to baseline Postop Assessment: no apparent nausea or vomiting Anesthetic complications: no   No complications documented.  Last Vitals:  Vitals:   03/21/20 1235 03/21/20 1305  BP: (!) 134/58 138/65  Pulse: 79 68  Resp: 15 14  Temp:  36.6 C  SpO2: 98% 98%    Last Pain:  Vitals:   03/21/20 1305  TempSrc:   PainSc: 0-No pain                 [name] S

## 2020-03-21 NOTE — Anesthesia Procedure Notes (Signed)
Procedure Name: MAC Date/Time: 03/21/2020 9:36 AM Performed by: Barrington Ellison, CRNA Pre-anesthesia Checklist: Patient identified, Emergency Drugs available, Suction available and Patient being monitored Patient Re-evaluated:Patient Re-evaluated prior to induction Oxygen Delivery Method: Simple face mask

## 2020-03-22 DIAGNOSIS — M1611 Unilateral primary osteoarthritis, right hip: Secondary | ICD-10-CM | POA: Diagnosis not present

## 2020-03-22 LAB — CBC
HCT: 32.5 % — ABNORMAL LOW (ref 36.0–46.0)
Hemoglobin: 10.1 g/dL — ABNORMAL LOW (ref 12.0–15.0)
MCH: 28.4 pg (ref 26.0–34.0)
MCHC: 31.1 g/dL (ref 30.0–36.0)
MCV: 91.3 fL (ref 80.0–100.0)
Platelets: 309 10*3/uL (ref 150–400)
RBC: 3.56 MIL/uL — ABNORMAL LOW (ref 3.87–5.11)
RDW: 15.4 % (ref 11.5–15.5)
WBC: 9.8 10*3/uL (ref 4.0–10.5)
nRBC: 0 % (ref 0.0–0.2)

## 2020-03-22 LAB — BASIC METABOLIC PANEL
Anion gap: 9 (ref 5–15)
BUN: 22 mg/dL (ref 8–23)
CO2: 25 mmol/L (ref 22–32)
Calcium: 8.8 mg/dL — ABNORMAL LOW (ref 8.9–10.3)
Chloride: 103 mmol/L (ref 98–111)
Creatinine, Ser: 2.06 mg/dL — ABNORMAL HIGH (ref 0.44–1.00)
GFR, Estimated: 24 mL/min — ABNORMAL LOW (ref >60)
Glucose, Bld: 125 mg/dL — ABNORMAL HIGH (ref 70–99)
Potassium: 4.1 mmol/L (ref 3.5–5.1)
Sodium: 137 mmol/L (ref 135–145)

## 2020-03-22 MED ORDER — METHOCARBAMOL 500 MG PO TABS: 500.0000 mg | ORAL_TABLET | Freq: Four times a day (QID) | ORAL | 1 refills | Status: DC | PRN

## 2020-03-22 MED ORDER — OXYCODONE HCL 5 MG PO TABS: 5.0000 mg | ORAL_TABLET | ORAL | 0 refills | Status: DC | PRN

## 2020-03-22 NOTE — Evaluation (Signed)
Occupational Therapy Evaluation Patient Details Name: Monica Serrano MRN: [id number] DOB: 02/07/1951 Today's Date: 03/22/2020    History of Present Illness Patient is 69 y/o female with PMH of CKD, DVT, lymphedema, HTN, OA, sleep apnea, and vertigo. Patient underwent R THA on 10/15 due to longstanding and worsening pain in right hip starting 3 years ago. Patient is currently WBAT.    Clinical Impression   PTA patient independent using RW for ADLs, mobility. Admitted for above and limited by problem list below, including pain in R LE, impaired balance, decreased activity tolerance. Some decreased STM noted, as pt with no recall of weightbearing status. Patient completing bed mobility with min guard to min assist, transfers with min guard to min assist for safety using RW with cueing for hand placement, UB ADls with setup seated and LB ADLs with mod- max assist.  Pt has reacher/sock aide at home, initiated review of use for LB ADLs but will benefit from further education.  Based on performance today, believe patient will benefit from further OT services while admitted and after dc at [facility] level given initial 24/7 support to optimize independence, safety with ADLs, mobility.     Follow Up Recommendations  Home health OT;Supervision/Assistance - 24 hour    Equipment Recommendations  None recommended by OT    Recommendations for Other Services       Precautions / Restrictions Precautions Precautions: Anterior Hip;Fall Precaution Comments: reviewed precautions with patient Restrictions Weight Bearing Restrictions: Yes RLE Weight Bearing: Weight bearing as tolerated      Mobility Bed Mobility Overal bed mobility: Needs Assistance Bed Mobility: Supine to Sit;Sit to Supine     Supine to sit: Min guard Sit to supine: Min assist   General bed mobility comments: min guard to transition to EOB and min assist to bring R LE back to supine; increased time and effort  Transfers Overall  transfer level: Needs assistance Equipment used: Rolling walker (2 wheeled) Transfers: Sit to/from Stand Sit to Stand: Min guard;Min assist         General transfer comment: min assist to min guard for safety/balance     Balance Overall balance assessment: Needs assistance Sitting-balance support: No upper extremity supported;Feet supported Sitting balance-Leahy Scale: Good     Standing balance support: Bilateral upper extremity supported;During functional activity;No upper extremity supported Standing balance-Leahy Scale: Fair Standing balance comment: relies on BUE support dynamically, able to engage in ADLs sta tically with min guard                           ADL either performed or assessed with clinical judgement   ADL Overall ADL's : Needs assistance/impaired     Grooming: Set up;Sitting   Upper Body Bathing: Set up;Sitting   Lower Body Bathing: Moderate assistance;Sit to/from stand   Upper Body Dressing : Set up;Sitting   Lower Body Dressing: Maximal assistance;Sit to/from stand;Cueing for compensatory techniques Lower Body Dressing Details (indicate cue type and reason): educated on use of sock aide, reacher; pt requires assist for socks and threading brief but able to pull up over hips with min assist only Toilet Transfer: Minimal assistance;Ambulation;RW;BSC;Regular Teacher, adult education Details (indicate cue type and reason): 3:1 over commode          Functional mobility during ADLs: Min guard;Rolling walker;Cueing for sequencing General ADL Comments: pt limited by pain, decreased ROM of R LE and impaired balance      Vision  Perception     Praxis      Pertinent Vitals/Pain Pain Assessment: 0-10 Pain Score: 5  Pain Location: R hip Pain Descriptors / Indicators: Sore;Shooting;Grimacing;Guarding;Aching;Burning Pain Intervention(s): Limited activity within patient's tolerance;Monitored during session;Repositioned     Hand  Dominance     Extremity/Trunk Assessment Upper Extremity Assessment Upper Extremity Assessment: Overall WFL for tasks assessed   Lower Extremity Assessment Lower Extremity Assessment: Defer to PT evaluation       Communication Communication Communication: HOH   Cognition Arousal/Alertness: Awake/alert Behavior During Therapy: WFL for tasks assessed/performed Overall Cognitive Status: Impaired/Different from baseline Area of Impairment: Memory                     Memory: Decreased short-term memory         General Comments: some decreased short term recall noted   General Comments  pt reports neice will provide 24/7 until monday, will look into assist after that     Exercises     Shoulder Instructions      Home Living Family/patient expects to be discharged to:: Private residence Living Arrangements: Alone Available Help at Discharge: Family;Available PRN/intermittently Type of Home: House Home Access: Ramped entrance     Home Layout: One level     Bathroom Shower/Tub: Producer, television/film/video: Handicapped height Bathroom Accessibility: No   Home Equipment: Environmental consultant - 2 wheels;Shower seat - built in;Hand held shower head;Grab bars - toilet;Bedside commode;Adaptive equipment Adaptive Equipment: Reacher;Sock aid Additional Comments: niece will be available till monday to assist      Prior Functioning/Environment Level of Independence: Independent with assistive device(s)        Comments: reports independent ADLs, mobility using RW (RW does not ft in bathroom)        OT Problem List: Decreased strength;Decreased activity tolerance;Impaired balance (sitting and/or standing);Decreased knowledge of use of DME or AE;Decreased knowledge of precautions;Obesity;Pain      OT Treatment/Interventions: Self-care/ADL training;DME and/or AE instruction;Therapeutic activities;Patient/family education;Balance training    OT Goals(Current goals can be  found in the care plan section) Acute Rehab OT Goals Patient Stated Goal: less pain, take care of myself again OT Goal Formulation: With patient Time For Goal Achievement: 04/05/20 Potential to Achieve Goals: Good  OT Frequency: Min 2X/week   Barriers to D/C:            Co-evaluation              AM-PAC OT "6 Clicks" Daily Activity     Outcome Measure Help from another person eating meals?: None Help from another person taking care of personal grooming?: A Little Help from another person toileting, which includes using toliet, bedpan, or urinal?: A Lot Help from another person bathing (including washing, rinsing, drying)?: A Lot Help from another person to put on and taking off regular upper body clothing?: A Little Help from another person to put on and taking off regular lower body clothing?: A Lot 6 Click Score: 16   End of Session Equipment Utilized During Treatment: Gait belt;Rolling walker Nurse Communication: Mobility status;Other (comment) (pain)  Activity Tolerance: Patient tolerated treatment well Patient left: in bed;with call bell/phone within reach;with bed alarm set;with SCD's reapplied  OT Visit Diagnosis: Other abnormalities of gait and mobility (R26.89);Pain Pain - Right/Left: Right Pain - part of body: Hip                Time: 6283-6629 OT Time Calculation (min): 38 min Charges:  OT General  Charges $OT Visit: 1 Visit OT Evaluation $OT Eval Moderate Complexity: 1 Mod OT Treatments $Self Care/Home Management : 8-22 mins  Barry Brunner, OT Acute Rehabilitation Services Pager [phone] Office [phone]   Chancy Milroy 03/22/2020, 9:24 AM

## 2020-03-22 NOTE — Progress Notes (Signed)
Physical Therapy Treatment Patient Details Name: Monica Serrano MRN: [id number] DOB: 1951-04-20 Today's Date: 03/22/2020    History of Present Illness Patient is 69 y/o female with PMH of CKD, DVT, lymphedema, HTN, OA, sleep apnea, and vertigo. Patient underwent R THA on 10/15 due to longstanding and worsening pain in right hip starting 3 years ago. Patient is currently WBAT.     PT Comments    Pt is progressing well towards goals. She increased ambulation distance and performed supine HEP. Reviwed hip precautions with pt as she was unable to recall. Plan for second PT treatment this afternoon prior to d/c home.     Follow Up Recommendations  Home health PT;Supervision for mobility/OOB     Equipment Recommendations  3in1 (PT)    Recommendations for Other Services OT consult     Precautions / Restrictions Precautions Precautions: Anterior Hip;Fall Precaution Booklet Issued: Yes (comment) Precaution Comments: reviewed precautions with patient Restrictions Weight Bearing Restrictions: Yes RLE Weight Bearing: Weight bearing as tolerated    Mobility  Bed Mobility Overal bed mobility: Needs Assistance Bed Mobility: Supine to Sit;Sit to Supine     Supine to sit: Min guard Sit to supine: Min assist   General bed mobility comments: Min guard with increased time and effort to transition to EOB. Min A to progress LEs back onto bed.  Transfers Overall transfer level: Needs assistance Equipment used: Rolling walker (2 wheeled) Transfers: Sit to/from Stand Sit to Stand: Min guard         General transfer comment: close min guard for safety.  Ambulation/Gait Ambulation/Gait assistance: Min guard Gait Distance (Feet): 125 Feet Assistive device: Rolling walker (2 wheeled) Gait Pattern/deviations: Decreased stance time - right;Trunk flexed;Step-through pattern;Antalgic Gait velocity: decreased   General Gait Details: Pt with mildly antalgic gait and step through pattern.  Overall steady but slow cadence. 2x standing rest break secondary to fatigue.    Stairs             Wheelchair Mobility    Modified Rankin (Stroke Patients Only)       Balance Overall balance assessment: Needs assistance Sitting-balance support: No upper extremity supported;Feet supported Sitting balance-Leahy Scale: Good     Standing balance support: Bilateral upper extremity supported;During functional activity;No upper extremity supported Standing balance-Leahy Scale: Fair Standing balance comment: reliant on UE support                            Cognition Arousal/Alertness: Awake/alert Behavior During Therapy: WFL for tasks assessed/performed Overall Cognitive Status: Within Functional Limits for tasks assessed Area of Impairment: Memory                     Memory: Decreased short-term memory         General Comments: some decreased short term recall noted      Exercises Total Joint Exercises Ankle Circles/Pumps: AROM;Both;10 reps;Supine Quad Sets: AROM;Right;10 reps;Supine Short Arc Quad: AROM;Right;10 reps;Supine Heel Slides: AROM;Right;10 reps;Supine Hip ABduction/ADduction: AROM;Right;10 reps;Supine    General Comments General comments (skin integrity, edema, etc.): pt reports neice will provide 24/7 until monday, will look into assist after that       Pertinent Vitals/Pain Pain Assessment: Faces Pain Score: 5  Faces Pain Scale: Hurts little more Pain Location: R hip Pain Descriptors / Indicators: Sore;Shooting;Grimacing;Guarding;Aching;Burning Pain Intervention(s): Monitored during session;Limited activity within patient's tolerance    Home Living Family/patient expects to be discharged to:: Private residence Living Arrangements: Alone  Available Help at Discharge: Family;Available PRN/intermittently Type of Home: House Home Access: Ramped entrance   Home Layout: One level Home Equipment: Walker - 2 wheels;Shower seat  - built in;Hand held shower head;Grab bars - toilet;Bedside commode;Adaptive equipment Additional Comments: niece will be available till monday to assist    Prior Function Level of Independence: Independent with assistive device(s)      Comments: reports independent ADLs, mobility using RW (RW does not ft in bathroom)   PT Goals (current goals can now be found in the care plan section) Acute Rehab PT Goals Patient Stated Goal: less pain, take care of myself again PT Goal Formulation: With patient Time For Goal Achievement: 04/04/20 Potential to Achieve Goals: Good Progress towards PT goals: Progressing toward goals    Frequency    7X/week (BID)      PT Plan Current plan remains appropriate    Co-evaluation              AM-PAC PT "6 Clicks" Mobility   Outcome Measure  Help needed turning from your back to your side while in a flat bed without using bedrails?: A Little Help needed moving from lying on your back to sitting on the side of a flat bed without using bedrails?: A Little Help needed moving to and from a bed to a chair (including a wheelchair)?: A Little Help needed standing up from a chair using your arms (e.g., wheelchair or bedside chair)?: A Little Help needed to walk in hospital room?: A Little Help needed climbing 3-5 steps with a railing? : A Lot 6 Click Score: 17    End of Session Equipment Utilized During Treatment: Gait belt Activity Tolerance: Patient tolerated treatment well Patient left: in bed;with call bell/phone within reach Nurse Communication: Mobility status PT Visit Diagnosis: Unsteadiness on feet (R26.81);Other abnormalities of gait and mobility (R26.89);Muscle weakness (generalized) (M62.81);Difficulty in walking, not elsewhere classified (R26.2);Pain Pain - Right/Left: Right Pain - part of body: Hip     Time: 5809-9833 PT Time Calculation (min) (ACUTE ONLY): 51 min  Charges:  $Gait Training: 23-37 mins $Therapeutic Exercise:  8-22 mins                     Kallie Locks, [place] Pager [phone] Acute Rehab   Sheral Apley 03/22/2020, 10:26 AM

## 2020-03-22 NOTE — Progress Notes (Signed)
Physical Therapy Treatment Patient Details Name: Monica Serrano MRN: [id number] DOB: 17-Oct-1950 Today's Date: 03/22/2020    History of Present Illness Patient is 69 y/o female with PMH of CKD, DVT, lymphedema, HTN, OA, sleep apnea, and vertigo. Patient underwent R THA on 10/15 due to longstanding and worsening pain in right hip starting 3 years ago. Patient is currently WBAT.     PT Comments    Pt up in chair w/o arm rests on arrival to room. She reports elevated pain levels and hesitantly to mobilize due to pain.  RN notified and arrived during session to administer pain meds. Pt was able to complete HEP training and ambulate short distance in room. Distance limited secondary to pain and light headedness. Pt also reporting tingling in hands. SpO2 initially reading at 72%, however likely an unreliable reading at pt was not SOB and no change in skin tone. SpO2 quickly returned to 100% with seated rest break. Pt is expected to d/c this afternoon with HHPT to follow up.    Follow Up Recommendations  Home health PT;Supervision for mobility/OOB     Equipment Recommendations  3in1 (PT)    Recommendations for Other Services OT consult     Precautions / Restrictions Precautions Precautions: Anterior Hip;Fall Precaution Booklet Issued: Yes (comment) Precaution Comments: reviewed precautions with patient Restrictions Weight Bearing Restrictions: Yes RLE Weight Bearing: Weight bearing as tolerated    Mobility  Bed Mobility Overal bed mobility: Needs Assistance Bed Mobility: Sit to Supine       Sit to supine: Min assist   General bed mobility comments: min A for LE management back into bed.  Transfers Overall transfer level: Needs assistance Equipment used: Rolling walker (2 wheeled) Transfers: Sit to/from Stand Sit to Stand: Min assist;Mod assist         General transfer comment: pt requiring additional assist when rising from a chair with no arm rests. Min A 3x, mod A on 4th  transfer as pt had fatigued.  Ambulation/Gait Ambulation/Gait assistance: Min guard Gait Distance (Feet): 8 Feet Assistive device: Rolling walker (2 wheeled) Gait Pattern/deviations: Decreased stance time - right;Trunk flexed;Step-through pattern;Antalgic Gait velocity: decreased   General Gait Details: distance limited today secondary to fatigue, pain, and light headedness.    Stairs             Wheelchair Mobility    Modified Rankin (Stroke Patients Only)       Balance Overall balance assessment: Needs assistance Sitting-balance support: No upper extremity supported;Feet supported Sitting balance-Leahy Scale: Good     Standing balance support: Bilateral upper extremity supported;During functional activity;No upper extremity supported Standing balance-Leahy Scale: Fair Standing balance comment: reliant on UE support                            Cognition Arousal/Alertness: Awake/alert Behavior During Therapy: WFL for tasks assessed/performed Overall Cognitive Status: Within Functional Limits for tasks assessed Area of Impairment: Memory                     Memory: Decreased short-term memory         General Comments: decreased recall of precautions and other instructions provided during session.       Exercises Total Joint Exercises Long Arc Quad: AROM;Right;10 reps;Seated Knee Flexion: AROM;Right;10 reps;Standing Marching in Standing: AROM;Both;10 reps;Standing    General Comments General comments (skin integrity, edema, etc.): neice present and helfpul during session.  Pertinent Vitals/Pain Pain Assessment: Faces Faces Pain Scale: Hurts even more Pain Location: R hip Pain Descriptors / Indicators: Sore;Shooting;Grimacing;Guarding;Aching;Burning Pain Intervention(s): Monitored during session;Limited activity within patient's tolerance;Repositioned;Patient requesting pain meds-RN notified;RN gave pain meds during session     Home Living                      Prior Function            PT Goals (current goals can now be found in the care plan section) Acute Rehab PT Goals Patient Stated Goal: less pain, take care of myself again PT Goal Formulation: With patient Time For Goal Achievement: 04/04/20 Potential to Achieve Goals: Good Progress towards PT goals: Progressing toward goals    Frequency    7X/week (BID)      PT Plan Current plan remains appropriate    Co-evaluation              AM-PAC PT "6 Clicks" Mobility   Outcome Measure  Help needed turning from your back to your side while in a flat bed without using bedrails?: A Little Help needed moving from lying on your back to sitting on the side of a flat bed without using bedrails?: A Little Help needed moving to and from a bed to a chair (including a wheelchair)?: A Little Help needed standing up from a chair using your arms (e.g., wheelchair or bedside chair)?: A Little Help needed to walk in hospital room?: A Little Help needed climbing 3-5 steps with a railing? : A Lot 6 Click Score: 17    End of Session Equipment Utilized During Treatment: Gait belt Activity Tolerance: Patient tolerated treatment well;Patient limited by pain Patient left: in bed;with call bell/phone within reach;with family/visitor present Nurse Communication: Mobility status PT Visit Diagnosis: Unsteadiness on feet (R26.81);Other abnormalities of gait and mobility (R26.89);Muscle weakness (generalized) (M62.81);Difficulty in walking, not elsewhere classified (R26.2);Pain Pain - Right/Left: Right Pain - part of body: Hip     Time: 1355-1435 PT Time Calculation (min) (ACUTE ONLY): 40 min  Charges:  $Gait Training: 8-22 mins $Therapeutic Exercise: 8-22 mins $Therapeutic Activity: 8-22 mins                    Kallie Locks, [place] Pager [phone] Acute Rehab  Sheral Apley 03/22/2020, 2:43 PM

## 2020-03-22 NOTE — Care Management Obs Status (Signed)
MEDICARE OBSERVATION STATUS NOTIFICATION   Patient Details  Name: Monica Serrano MRN: [id number] Date of Birth: 1951/02/03   Medicare Observation Status Notification Given:  Yes    Bess Kinds, RN 03/22/2020, 11:32 AM

## 2020-03-22 NOTE — Progress Notes (Signed)
I informed Dr. Roda Shutters that patient has only voided 75 cc-100 cc  in the Pure wick since foley catheter being removed.  Bladder scan completed and noted 213 cc in bladder.  Dr. Roda Shutters was informed.  Dr. Roda Shutters reports to continue to push fluids and medication was discontinued.  I have encouraged patient to continue to drink plenty of fluids.  Patient ordering breakfast.  Water given to patient to increase fluids to promote more urine output.

## 2020-03-22 NOTE — Progress Notes (Signed)
D/C education given to patient, no complaints voiced. Patient stated she is waiting on her niece who will be coming to get her around 2pm.

## 2020-03-22 NOTE — TOC Transition Note (Signed)
Transition of Care [facility]) - CM/SW Discharge Note   Patient Details  Name: Bita Cartwright MRN: [id number] Date of Birth: 1951/04/02  [facility]) CM/SW Contact:  Bess Kinds, RN Phone Number: [phone] 03/22/2020, 11:32 AM   Clinical Narrative:     Spoke with patient at the bedside to provide notification of observation status. Discussed transition plans. [facility] to follow up after DC for [facility] PT. DME 3N1 verified as being delivered to the room. No further TOC needs identified.   Final next level of care: Home w Home Health Services Barriers to Discharge: No Barriers Identified   Patient Goals and CMS Choice Patient states their goals for this hospitalization and ongoing recovery are:: to return tohome CMS Medicare.gov Compare Post Acute Care list provided to:: Patient Choice offered to / list presented to : Patient  Discharge Placement                       Discharge Plan and Services   Discharge Planning Services: CM Consult Post Acute Care Choice: Home Health          DME Arranged: 3-N-1 DME Agency: [employer] Date DME Agency Contacted: 03/21/20 Time DME Agency Contacted: [phone] Representative spoke with at DME Agency: Velna Hatchet HH Arranged: PT HH Agency: [facility] Date [facility] Agency Contacted: 03/22/20 Time HH Agency Contacted: [phone] Representative spoke with at [facility] Agency: Amy  Social Determinants of Health (SDOH) Interventions     Readmission Risk Interventions No flowsheet data found.

## 2020-03-22 NOTE — Discharge Summary (Signed)
Patient ID: Monica Serrano MRN: [id number] DOB/AGE: February 23, 1951 69 y.o.  Admit date: 03/21/2020 Discharge date: 03/22/2020  Admission Diagnoses:  Principal Problem:   Unilateral primary osteoarthritis, right hip Active Problems:   Status post total replacement of right hip   Discharge Diagnoses:  Same  Past Medical History:  Diagnosis Date  . Anxiety   . CKD (chronic kidney disease)   . Depression   . DVT, lower extremity (HCC) 2019   right leg  . Headache    Migraines - years ago  . History of kidney stones    passed  . Hyperlipemia   . Hypertension   . Lymphedema   . Osteoarthritis   . Sleep apnea   . Vertigo     Surgeries: Procedure(s): RIGHT TOTAL HIP ARTHROPLASTY ANTERIOR APPROACH on 03/21/2020   Consultants:   Discharged Condition: Improved  Hospital Course: Monica Serrano is an 69 y.o. female who was admitted 03/21/2020 for operative treatment ofUnilateral primary osteoarthritis, right hip. Patient has severe unremitting pain that affects sleep, daily activities, and work/hobbies. After pre-op clearance the patient was taken to the operating room on 03/21/2020 and underwent  Procedure(s): RIGHT TOTAL HIP ARTHROPLASTY ANTERIOR APPROACH.    Patient was given perioperative antibiotics:  Anti-infectives (From admission, onward)   Start     Dose/Rate Route Frequency Ordered Stop   03/21/20 2200  ceFAZolin (ANCEF) IVPB 1 g/50 mL premix        1 g 100 mL/hr over 30 Minutes Intravenous Every 12 hours 03/21/20 1334 03/21/20 2134   03/21/20 1600  ceFAZolin (ANCEF) IVPB 1 g/50 mL premix  Status:  Discontinued        1 g 100 mL/hr over 30 Minutes Intravenous Every 6 hours 03/21/20 1324 03/21/20 1334   03/21/20 0746  ceFAZolin (ANCEF) 2-4 GM/100ML-% IVPB       Note to Pharmacy: Lorenda Ishihara   : cabinet override      03/21/20 0746 03/21/20 0944   03/21/20 0745  ceFAZolin (ANCEF) IVPB 2g/100 mL premix        2 g 200 mL/hr over 30 Minutes Intravenous On call to O.R.  03/21/20 0740 03/21/20 3785       Patient was given sequential compression devices, early ambulation, and chemoprophylaxis to prevent DVT.  Patient benefited maximally from hospital stay and there were no complications.    Recent vital signs:  Patient Vitals for the past 24 hrs:  BP Temp Temp src Pulse Resp SpO2  03/22/20 1059 (!) 108/53 98.5 F (36.9 C) Oral 89 -- --  03/22/20 0557 101/66 97.7 F (36.5 C) -- 80 18 90 %  03/22/20 0142 (!) 103/53 -- -- 78 -- 96 %     Recent laboratory studies:  Recent Labs    03/22/20 0133  WBC 9.8  HGB 10.1*  HCT 32.5*  PLT 309  NA 137  K 4.1  CL 103  CO2 25  BUN 22  CREATININE 2.06*  GLUCOSE 125*  CALCIUM 8.8*     Discharge Medications:   Allergies as of 03/22/2020   No Known Allergies     Medication List    STOP taking these medications   oxyCODONE-acetaminophen 7.5-325 MG tablet Commonly known as: PERCOCET     TAKE these medications   acetaminophen 500 MG tablet Commonly known as: TYLENOL Take 1,000 mg by mouth every 8 (eight) hours as needed for moderate pain or headache.   atorvastatin 20 MG tablet Commonly known as: LIPITOR Take 20 mg by mouth daily.  calcium carbonate 1500 (600 Ca) MG Tabs tablet Commonly known as: OSCAL Take 600 mg of elemental calcium by mouth 2 (two) times daily with a meal.   candesartan 16 MG tablet Commonly known as: ATACAND Take 16 mg by mouth daily.   cetirizine 10 MG tablet Commonly known as: ZYRTEC Take 10 mg by mouth daily as needed for allergies.   cholecalciferol 25 MCG (1000 UNIT) tablet Commonly known as: VITAMIN D3 Take 2,000 Units by mouth daily.   docusate sodium 100 MG capsule Commonly known as: COLACE Take 100 mg by mouth every other day.   DULoxetine 60 MG capsule Commonly known as: CYMBALTA Take 60 mg by mouth daily.   fluticasone 50 MCG/ACT nasal spray Commonly known as: FLONASE Place 1 spray into both nostrils daily as needed for allergies or  rhinitis.   furosemide 40 MG tablet Commonly known as: LASIX Take 40 mg by mouth daily.   gabapentin 300 MG capsule Commonly known as: NEURONTIN Take 300 mg by mouth 4 (four) times daily.   ibuprofen 200 MG tablet Commonly known as: ADVIL Take 800 mg by mouth every 8 (eight) hours as needed for headache or moderate pain.   lansoprazole 30 MG capsule Commonly known as: PREVACID Take 30 mg by mouth 2 (two) times daily.   methocarbamol 500 MG tablet Commonly known as: ROBAXIN Take 1 tablet (500 mg total) by mouth every 6 (six) hours as needed for muscle spasms.   oxyCODONE 5 MG immediate release tablet Commonly known as: Oxy IR/ROXICODONE Take 1-2 tablets (5-10 mg total) by mouth every 4 (four) hours as needed for moderate pain (pain score 4-6).   phentermine 30 MG capsule Take 30 mg by mouth daily.   promethazine 25 MG tablet Commonly known as: PHENERGAN Take 25 mg by mouth every 6 (six) hours as needed (Vertigo).   rOPINIRole 1 MG tablet Commonly known as: REQUIP Take 1 mg by mouth at bedtime.       Diagnostic Studies: DG Pelvis Portable  Result Date: 03/21/2020 CLINICAL DATA:  Total hip replacement on right EXAM: PORTABLE PELVIS 1-2 VIEWS COMPARISON:  Intraoperative images March 21, 2020; March 10, 2020 pelvis and right hip radiographs FINDINGS: Frontal view obtained. There is a total hip replacement on the right with prosthetic components well-seated on frontal view. No fracture or dislocation. There is mild narrowing of the left hip joint. IMPRESSION: Status post total hip replacement on the right with prosthetic components well-seated on frontal view. No fracture or dislocation. Narrowing left hip joint. Electronically Signed   By: Bretta Bang III M.D.   On: 03/21/2020 12:24   DG C-Arm 1-60 Min  Result Date: 03/21/2020 CLINICAL DATA:  Right total hip arthroplasty. EXAM: OPERATIVE right HIP (WITH PELVIS IF PERFORMED) 5 VIEWS TECHNIQUE: Fluoroscopic spot  image(s) were submitted for interpretation post-operatively. Radiation exposure index: 3.6230 mGy. COMPARISON:  January 09, 2020. FINDINGS: Five intraoperative fluoroscopic images were obtained of the right hip. The right acetabular and femoral components appear to be well situated. IMPRESSION: Status post right total hip arthroplasty. Electronically Signed   By: Lupita Raider M.D.   On: 03/21/2020 11:53   DG HIP OPERATIVE UNILAT WITH PELVIS RIGHT  Result Date: 03/21/2020 CLINICAL DATA:  Right total hip arthroplasty. EXAM: OPERATIVE right HIP (WITH PELVIS IF PERFORMED) 5 VIEWS TECHNIQUE: Fluoroscopic spot image(s) were submitted for interpretation post-operatively. Radiation exposure index: 3.6230 mGy. COMPARISON:  January 09, 2020. FINDINGS: Five intraoperative fluoroscopic images were obtained of the right hip. The  right acetabular and femoral components appear to be well situated. IMPRESSION: Status post right total hip arthroplasty. Electronically Signed   By: Lupita Raider M.D.   On: 03/21/2020 11:53    Disposition: Discharge disposition: 01-Home or Self Care          Follow-up Information    Kathryne Hitch, MD Follow up in 2 week(s).   Specialty: Orthopedic Surgery Contact information: [address] [place] [address] [phone]        [facility] Follow up.   Why: the office will call to schedule home health visits Contact information: [address] [place], [place] [address] [phone]               Signed: Kathryne Hitch 03/22/2020, 10:11 PM

## 2020-03-22 NOTE — Progress Notes (Signed)
   Subjective:  Patient reports pain as mild.    Objective:   VITALS:   Vitals:   03/21/20 1632 03/21/20 2105 03/22/20 0142 03/22/20 0557  BP: (!) 109/59 (!) 105/57 (!) 103/53 101/66  Pulse: 97 83 78 80  Resp: 18 17  18   Temp: (!) 97.5 F (36.4 C) 97.9 F (36.6 C)  97.7 F (36.5 C)  TempSrc: Oral Oral    SpO2: 97% 99% 96% 90%  Weight:      Height:        Neurovascular intact Incision: dressing C/D/I and no drainage   Lab Results  Component Value Date   WBC 9.8 03/22/2020   HGB 10.1 (L) 03/22/2020   HCT 32.5 (L) 03/22/2020   MCV 91.3 03/22/2020   PLT 309 03/22/2020     Assessment/Plan:  1 Day Post-Op   - Expected postop acute blood loss anemia - will monitor for symptoms - Up with PT/OT - DVT ppx - SCDs, ambulation, aspirin - WBAT operative extremity - hold toradol for now, Cr slightly elevated and has not voided much, will push po fluids - Pain control - Discharge planning   03/24/2020 03/22/2020, 6:27 AM [phone]

## 2020-03-24 ENCOUNTER — Encounter (HOSPITAL_COMMUNITY): Payer: Self-pay | Admitting: Orthopaedic Surgery

## 2020-03-26 ENCOUNTER — Telehealth: Payer: Self-pay | Admitting: Orthopaedic Surgery

## 2020-03-26 NOTE — Telephone Encounter (Signed)
That is fine 

## 2020-03-26 NOTE — Telephone Encounter (Signed)
Tried calling therapist back. No answer and voice mail was full

## 2020-03-26 NOTE — Telephone Encounter (Signed)
Please advise 

## 2020-03-26 NOTE — Telephone Encounter (Signed)
Oliver Barre (PT) from [facility] called requesting verbal orders for pt for 2 wk 1, 3 wk 2, and 1 wk 2. Please call Oliver Barre back to confirm. If she is unable to answer please leave detailed message on secure line. Oliver Barre phone number is [phone].

## 2020-03-27 ENCOUNTER — Telehealth: Payer: Self-pay | Admitting: Orthopaedic Surgery

## 2020-03-27 DIAGNOSIS — Z96651 Presence of right artificial knee joint: Secondary | ICD-10-CM

## 2020-03-27 NOTE — Telephone Encounter (Signed)
Called and discussed this with pt's daughter

## 2020-03-27 NOTE — Telephone Encounter (Signed)
Ultrasound order placed in chart. Please call daughter at the number below to advise on time of ultrasound

## 2020-03-27 NOTE — Telephone Encounter (Signed)
Called and verbal ok was given 

## 2020-03-27 NOTE — Telephone Encounter (Signed)
Patient's daughter Drenda Freeze called with questions about patient's medications. Please call fran back at [phone].

## 2020-03-27 NOTE — Telephone Encounter (Signed)
Oliver Barre (PT) called to verify approval of verbal orders. Orders approved by PA Clark. No return call needed

## 2020-03-27 NOTE — Telephone Encounter (Signed)
I spoke with the daughter and she was concerned because the pt was never but on a anti coagulant after surgery. Pt has a history of DVT. No SOB. Daughter is concerned of DVT. Due to the time daughter was informed we will get an ultrasound done first thing in the morning. If needing to get one sooner she was advised to take her to the ER. Pt's daughter was also advised that per Bronson Curb to go ahead and start the 81mg  aspirin

## 2020-03-27 NOTE — Telephone Encounter (Signed)
Monica Serrano with [facility] called wanting to verify that the pt wasn't supposed to have a coagulation?  Stanton Kidney CB# [phone]

## 2020-03-28 ENCOUNTER — Other Ambulatory Visit: Payer: Self-pay

## 2020-03-28 ENCOUNTER — Telehealth: Payer: Self-pay | Admitting: Orthopaedic Surgery

## 2020-03-28 ENCOUNTER — Telehealth: Payer: Self-pay

## 2020-03-28 ENCOUNTER — Ambulatory Visit (HOSPITAL_COMMUNITY)
Admission: RE | Admit: 2020-03-28 | Discharge: 2020-03-28 | Disposition: A | Discharge status: Home / Self Care | Payer: Medicare Other | Source: Ambulatory Visit | Attending: Orthopaedic Surgery | Admitting: Orthopaedic Surgery

## 2020-03-28 DIAGNOSIS — Z96651 Presence of right artificial knee joint: Secondary | ICD-10-CM

## 2020-03-28 NOTE — Telephone Encounter (Signed)
Please advise 

## 2020-03-28 NOTE — Telephone Encounter (Signed)
Lvm for pt to call back to advise °

## 2020-03-28 NOTE — Telephone Encounter (Signed)
We do not keep up the office what she is looking for.  Just soft knee supports her knee sleeves are only picked up over-the-counter [facility] or [facility] in places such as that.  Insurance does not cover those types of "soft knee support" so we do not keep this.

## 2020-03-28 NOTE — Telephone Encounter (Signed)
FYI

## 2020-03-28 NOTE — Telephone Encounter (Signed)
Monica Serrano with [facility] wanted to let Dr. Magnus Ivan know that patient is Negative for DVT.

## 2020-03-28 NOTE — Telephone Encounter (Signed)
Per [place] with [facility]. pt is sch for 2:45 she couldnt come this morning, she lives in [place], [place] that is 1 1/2 hour away

## 2020-03-28 NOTE — Telephone Encounter (Signed)
Patient called. She would like a soft knee support. Her call back number is [phone]

## 2020-03-31 ENCOUNTER — Telehealth: Payer: Self-pay

## 2020-03-31 NOTE — Telephone Encounter (Signed)
Lvm for her to return call to inform

## 2020-03-31 NOTE — Telephone Encounter (Signed)
03/21/20 right THA

## 2020-03-31 NOTE — Telephone Encounter (Signed)
Patient called in to notify she spoke with  jo jo with [facility] , she had her right hip replace and has a lot of drainage, has  A friend that changed it for her and its still swollen and draining .Marland Kitchen  Wanted to know what to do as far as changing . Said draining was terrible

## 2020-03-31 NOTE — Telephone Encounter (Signed)
Patient aware that she may take off her bandage and put a new one on or a dry dressing We called [facility] and left message to call us back to give them this same info

## 2020-03-31 NOTE — Telephone Encounter (Signed)
I am fine with him changing her dressing.

## 2020-03-31 NOTE — Telephone Encounter (Signed)
Jojo from [facility] called she stated she stated patients wounds are  red,swollen, draining and have a warm touch. She is wondering about the schedule for dressing change. She is asking for  A nurse evaluation and she stated she can have one of her nurses check dressing.Call back:[phone].

## 2020-04-03 ENCOUNTER — Ambulatory Visit (INDEPENDENT_AMBULATORY_CARE_PROVIDER_SITE_OTHER): Payer: Medicare Other | Admitting: Orthopaedic Surgery

## 2020-04-03 ENCOUNTER — Encounter: Payer: Self-pay | Admitting: Orthopaedic Surgery

## 2020-04-03 DIAGNOSIS — Z96641 Presence of right artificial hip joint: Secondary | ICD-10-CM

## 2020-04-03 MED ORDER — DOXYCYCLINE HYCLATE 100 MG PO TABS: 100.0000 mg | ORAL_TABLET | Freq: Two times a day (BID) | ORAL | 0 refills | Status: DC

## 2020-04-03 NOTE — Progress Notes (Signed)
The patient is around weeks status post a right total hip arthroplasty. She had debilitating arthritis in the right hip. She is someone who is morbidly obese and does have a large thigh. We closed this with just nylon sutures to try to take pressure off the tissue. She does feel like the hip is much better overall. She is currently no fever, chills, nausea, vomiting  On exam she does have a very large seroma and redness around the incision with some breakdown. There is no evidence of gross purulence. I was able to aspirate at least 200 more cc of fluid off of the soft tissues.  We will start her on doxycycline twice a day and have her keep this area clean and dry. I placed Xeroform over the incision as well. In 2 days she will start putting Bactroban ointment on the incision daily. I would like to see her back in just 4 days from now which will be Monday. If I am concerned about any other breakdown she understands that I would likely recommend an I&D of that. She understands fully that this is certainly a complication of anterior hip surgery with people with large obese thighs which she does have. All question concerns were answered and addressed. I will see her this coming Monday.

## 2020-04-04 ENCOUNTER — Telehealth: Payer: Self-pay | Admitting: Orthopaedic Surgery

## 2020-04-04 ENCOUNTER — Telehealth: Payer: Self-pay | Admitting: Radiology

## 2020-04-04 NOTE — Telephone Encounter (Signed)
JoJo called. She would like to know if PT is on hold? CB for Oliver Barre is [phone]

## 2020-04-04 NOTE — Telephone Encounter (Signed)
Please advise if ok ?

## 2020-04-04 NOTE — Telephone Encounter (Signed)
The incision is not really infected.  She just has a large seroma and abundant drainage due to her morbid obesity and large thigh.  I had to have her on antibiotics.  I expected to drain quite a bit.  I saw her in the office yesterday and I am seeing her again this Monday.  I am fine with him seeing her but we need to keep that incision is dry as possible.

## 2020-04-04 NOTE — Telephone Encounter (Signed)
Oliver Barre with [facility] is requesting verbal orders for skilled nursing evaluation on Ms. Baeten. She states that HHPT told her that incision is infected and they would like order to monitor it.  CB for Oliver Barre is [phone]

## 2020-04-04 NOTE — Telephone Encounter (Signed)
Jojo advised and stated understanding

## 2020-04-07 ENCOUNTER — Encounter: Payer: Self-pay | Admitting: Orthopaedic Surgery

## 2020-04-07 ENCOUNTER — Ambulatory Visit (INDEPENDENT_AMBULATORY_CARE_PROVIDER_SITE_OTHER): Payer: Medicare Other | Admitting: Orthopaedic Surgery

## 2020-04-07 DIAGNOSIS — Z96641 Presence of right artificial hip joint: Secondary | ICD-10-CM

## 2020-04-07 NOTE — Telephone Encounter (Signed)
Talked to [facility] and informed. She stated understanding. She was also advised that per Dr. Magnus Ivan he doesn't want skilled nursing.

## 2020-04-07 NOTE — Telephone Encounter (Signed)
They can resume her home health physical therapy with weightbearing as tolerated on her right hip and right knee and work on balance and coordination

## 2020-04-07 NOTE — Telephone Encounter (Signed)
Seeing pt today

## 2020-04-07 NOTE — Progress Notes (Signed)
The patient is following up 4 days after I saw her at her first postoperative visit.  She is a 69 year old female who is morbidly obese who we performed a right anterior hip replacement on recently.  She had developed a large postoperative seroma and some breakdown of her incision.  I have her on doxycycline as well.  She reports that she is feeling well other than knee pain.  She has a history of a knee replacement.  On examination her incision today looks much better than it did 4 days ago.  I still was able to aspirate a large seroma from the hip soft tissue.  There is no redness or induration.  The sutures are still intact.  There is an area about the size of a nickel at the superior third of the incision that will need ointment on it once a day with Bactroban ointment.  She can resume physical therapy at this point with weightbearing as tolerated and work on Investment banker, operational.  She will change the dressing this Wednesday and daily after that with a small amount of Bactroban ointment on the one area of the incision where I showed her and her friend to place this.  Like to see her back in 1 week for wound check.  If there is any issues before then they will let us know.

## 2020-04-07 NOTE — Telephone Encounter (Signed)
Lvm for JOJO to call back

## 2020-04-14 ENCOUNTER — Ambulatory Visit (INDEPENDENT_AMBULATORY_CARE_PROVIDER_SITE_OTHER): Payer: Medicare Other | Admitting: Orthopaedic Surgery

## 2020-04-14 ENCOUNTER — Encounter: Payer: Self-pay | Admitting: Orthopaedic Surgery

## 2020-04-14 ENCOUNTER — Other Ambulatory Visit: Payer: Self-pay

## 2020-04-14 DIAGNOSIS — Z96641 Presence of right artificial hip joint: Secondary | ICD-10-CM

## 2020-04-14 DIAGNOSIS — S71001D Unspecified open wound, right hip, subsequent encounter: Secondary | ICD-10-CM

## 2020-04-14 NOTE — Progress Notes (Signed)
The patient is here today still in follow-up status post a right total hip arthroplasty.  She has had a small area of breakdown of her right hip incision due to her morbid obesity with her right thigh.  There is been no evidence of infection.  Today I was able to express more fluid out from the wound and removed a couple of the sutures.  Again there is no redness no purulent drainage.  There is just a deep wound now from where she broke this down.  She needs wet-to-dry dressings at least once daily and I will refer her to the wound treatment center and [facility].  They may even consider VAC treatment.  This should still heal up with time and appropriate wound care.  She understands that this is one of the complications of anterior hip surgery with the obese population.  She does state that she is doing better from a pain standpoint and mobilizing better.  I would like to see her back in 1 week for wound check and removal of the remainder of her sutures.  All questions and concerns were answered and addressed.

## 2020-04-15 ENCOUNTER — Telehealth: Payer: Self-pay | Admitting: Orthopaedic Surgery

## 2020-04-15 ENCOUNTER — Other Ambulatory Visit: Payer: Self-pay

## 2020-04-15 DIAGNOSIS — S71001D Unspecified open wound, right hip, subsequent encounter: Secondary | ICD-10-CM

## 2020-04-15 NOTE — Telephone Encounter (Signed)
Called and advised.

## 2020-04-15 NOTE — Telephone Encounter (Signed)
JOJO called and would like a nurse eval for pt for right surgical incision.

## 2020-04-15 NOTE — Telephone Encounter (Signed)
Patient aware we are working on this.

## 2020-04-15 NOTE — Telephone Encounter (Signed)
Yes, I agree with that.  Have them perform right hip wound packing with a wet-to-dry dressing daily for the next 3 to 4 weeks.

## 2020-04-15 NOTE — Telephone Encounter (Signed)
Patient called asked if she has been referred to the wound care clinic in [facility]. Ph# to the facility is [phone].  Also patient asked if a nurse is coming out to do an eval at her home? The number to contact patient is [phone]

## 2020-04-16 ENCOUNTER — Telehealth: Payer: Self-pay | Admitting: Orthopaedic Surgery

## 2020-04-16 NOTE — Telephone Encounter (Signed)
What she needs every day is wet-to-dry dressings to her right hip wound.  That can be set up through a home health agency.  She still needs to be seen by a wound care specialist as an outpatient.  They need to at least evaluate her once and then they will determine how often they need to see her which would likely not be every day.

## 2020-04-16 NOTE — Telephone Encounter (Signed)
Pt called stating she was supposed to be getting wound care from [place] but they back logged 30 pts so the referred her to [place] who doesn't have any availability until 04/23/20 but she's supposed to be seen for wound care everyday and the Drs. Only see their pts once a week at the [place] facility until it heals enough for him to send a home health aid. The pt would like a CB to make sure this is ok since her originally orders were for everyday  [phone]

## 2020-04-16 NOTE — Telephone Encounter (Signed)
Patient aware that [facility] nursing nor the wound center is going to be everyday, this is when she will have to do wound care in between visits to herself

## 2020-04-17 ENCOUNTER — Telehealth: Payer: Self-pay | Admitting: Orthopaedic Surgery

## 2020-04-17 ENCOUNTER — Ambulatory Visit: Payer: Medicare Other | Admitting: Orthopaedic Surgery

## 2020-04-17 NOTE — Telephone Encounter (Signed)
Called and advised.

## 2020-04-17 NOTE — Telephone Encounter (Signed)
Brandi with [facility] called stating the pt is having a hard time getting seen for wound care at the facilities she was referred so Merry Proud would like to get the ok to cleanse the wound with saline packets and then pack it with calcium alginate ag and then wrap it with a dry dressing and change it once a week?  Merry Proud CB# [phone]

## 2020-04-17 NOTE — Telephone Encounter (Signed)
That will be totally fine.

## 2020-04-17 NOTE — Telephone Encounter (Signed)
Order for wound care has been sent to Wound clinic in [place]

## 2020-04-21 ENCOUNTER — Encounter: Payer: Self-pay | Admitting: Orthopaedic Surgery

## 2020-04-21 ENCOUNTER — Ambulatory Visit (INDEPENDENT_AMBULATORY_CARE_PROVIDER_SITE_OTHER): Payer: Medicare Other | Admitting: Orthopaedic Surgery

## 2020-04-21 VITALS — Ht 65.0 in | Wt 227.0 lb

## 2020-04-21 DIAGNOSIS — Z96641 Presence of right artificial hip joint: Secondary | ICD-10-CM

## 2020-04-21 MED ORDER — ONDANSETRON 4 MG PO TBDP: 4.0000 mg | ORAL_TABLET | Freq: Three times a day (TID) | ORAL | 0 refills | Status: DC | PRN

## 2020-04-21 MED ORDER — DOXYCYCLINE HYCLATE 100 MG PO TABS: 100.0000 mg | ORAL_TABLET | Freq: Two times a day (BID) | ORAL | 0 refills | Status: DC

## 2020-04-21 NOTE — Progress Notes (Signed)
The patient comes in today for wound check status post a right total hip arthroplasty.  She has wound dehiscence.  She is someone who is morbidly obese and has a large soft tissue envelope around her thigh.  She is feeling a little nauseated.  She denies any fever or chills.  We have been treating this with a 1 nurse and they put some alginate in this area.  I would prefer a wet-to-dry dressing.  She actually will see a wound treatment center later this week.  On exam I cannot push any fluid out of this today which is good.  There still is significant holding tracks down to the soft tissue deeply.  There is no redness of the skin.  And will prophylactically keep her on doxycycline and will try some Zofran for nausea.  I packed the wound with wet-to-dry dressing.  She will go to the wound center visit and in the interim continue just wet-to-dry dressing packing.  I would like to see her back in 2 weeks for wound check.

## 2020-05-05 ENCOUNTER — Ambulatory Visit: Payer: Medicare Other | Admitting: Orthopaedic Surgery

## 2020-05-05 ENCOUNTER — Telehealth: Payer: Self-pay | Admitting: Orthopaedic Surgery

## 2020-05-05 NOTE — Telephone Encounter (Signed)
Ok to give verbal orders?

## 2020-05-05 NOTE — Telephone Encounter (Signed)
Called and advised.

## 2020-05-05 NOTE — Telephone Encounter (Signed)
Monica Serrano  (PT) called from [facility] asking for an extension of home health pt. Asking 1 time this week and 2x week for 4 week. Monica Serrano is asking for a call back at [phone].

## 2020-05-05 NOTE — Telephone Encounter (Signed)
That will be fine. 

## 2020-05-13 ENCOUNTER — Telehealth: Payer: Self-pay | Admitting: Orthopaedic Surgery

## 2020-05-13 NOTE — Telephone Encounter (Signed)
Ok to give ok

## 2020-05-13 NOTE — Telephone Encounter (Signed)
Monica Serrano (PT) called requesting orders for evaluation of orphocit. Monica Serrano states patient is walking with a limp and one leg is short than the other due to surgery. Please call JoJo back at [phone]. If unavailable leave secure detailed message.

## 2020-05-13 NOTE — Telephone Encounter (Signed)
Per JOJO needs order for Orthotic eval right lower extremity Fax to  #[phone] with [id number]

## 2020-05-13 NOTE — Telephone Encounter (Signed)
Sure

## 2020-05-13 NOTE — Telephone Encounter (Signed)
Also wants order for right knee brace.is this ok to send as well?

## 2020-05-13 NOTE — Telephone Encounter (Signed)
Yes Orthotic

## 2020-05-14 NOTE — Telephone Encounter (Signed)
It has been faxed.  

## 2020-05-14 NOTE — Telephone Encounter (Signed)
Orders completed and will fax today

## 2020-05-14 NOTE — Telephone Encounter (Signed)
Do you mind faxing these for me?

## 2020-05-19 ENCOUNTER — Ambulatory Visit (INDEPENDENT_AMBULATORY_CARE_PROVIDER_SITE_OTHER): Payer: Medicare Other | Admitting: Orthopaedic Surgery

## 2020-05-19 ENCOUNTER — Ambulatory Visit: Payer: Self-pay

## 2020-05-19 ENCOUNTER — Other Ambulatory Visit: Payer: Self-pay

## 2020-05-19 ENCOUNTER — Encounter: Payer: Self-pay | Admitting: Orthopaedic Surgery

## 2020-05-19 DIAGNOSIS — T84030A Mechanical loosening of internal right hip prosthetic joint, initial encounter: Secondary | ICD-10-CM

## 2020-05-19 DIAGNOSIS — Z96641 Presence of right artificial hip joint: Secondary | ICD-10-CM

## 2020-05-19 DIAGNOSIS — M79604 Pain in right leg: Secondary | ICD-10-CM | POA: Diagnosis not present

## 2020-05-19 NOTE — Progress Notes (Signed)
Office Visit Note   Patient: Monica Serrano           Date of Birth: Nov 27, 1950           MRN: [id number] Visit Date: 05/19/2020              Requested by: Eulis Foster, MD [address] [place],  [place] [address] PCP: Eulis Foster, MD   Assessment & Plan: Visit Diagnoses:  1. Status post total replacement of right hip   2. Pain in right leg   3. Femoral loosening of prosthetic right hip, initial encounter (HCC)     Plan: Unfortunately to me, I feel that her acetabular component is in a different position and is loose.  This is based on meticulous comparison of x-rays at the time of surgery in the immediate postop as well as today.  This is also consistent with her clinical exam.  For now she does not limit her mobility on that hip and I need to get her set up for a revision of the right acetabular component.  I went over the x-rays with her in detail and explained what is going on.  I described the interoperative postoperative course of her previous surgery and what think is going on.  She does have an incisional VAC for healing of her previous hip where she had a small dehiscence.  There is no evidence of infection at all.  There is no fever and chills either.  She understands fully why we are recommending this.  She understands this is the challenge of operating on the morbidly obese population.  This appears to be aseptic loosening and there is no evidence of infection from my standpoint I think this is an acute issue.  All question concerns were answered addressed.  We will work on getting her in to surgery as an add-on case sometime in the next week to 2 weeks.  For now she will limit her mobility.  Follow-Up Instructions: Return for 2 weeks post-op.   Orders:  Orders Placed This Encounter  Procedures  . XR HIP UNILAT W OR W/O PELVIS 1V RIGHT  . XR Lumbar Spine 2-3 Views  . XR Knee 1-2 Views Right   No orders of the defined types were placed in this  encounter.     Procedures: No procedures performed   Clinical Data: No additional findings.   Subjective: Chief Complaint  Patient presents with  . Right Hip - Follow-up  The patient comes in today incredibly tearful as it relates to her feeling like her whole right lower extremity hurts and she is dragging her leg.  She is a morbidly obese individual who we performed a right total hip arthroplasty on in early October.  She did have a postoperative complication of wound dehiscence but did not require any type of surgery.  She says that wound has been healing up.  She has a history of right total knee arthroplasty done elsewhere and she is of that always hurts her.  She is very tearful saying she is even dragging her leg and does not know what to do.  HPI  Review of Systems She reports right lower extremity weakness.  She denies any headache, chest pain, shortness of breath, fever, chills, nausea, vomiting  Objective: Vital Signs: There were no vitals taken for this visit.  Physical Exam She is alert and oriented x3 and in no acute distress but obvious pain and is very tearful Ortho Exam Her  pain seems to the mainly her hip on my exam.  It is not really her right knee or right lower extremity other than the hip itself.  She does not like me putting her through internal or external rotation of that hip. Specialty Comments:  No specialty comments available.  Imaging: XR HIP UNILAT W OR W/O PELVIS 1V RIGHT  Result Date: 05/19/2020 An AP pelvis that is standing compared to a supine pelvis at the time of surgery shows that the acetabular component seems to be in a different position.  This is suggestive of component loosening.  XR Knee 1-2 Views Right  Result Date: 05/19/2020 2 views of the right knee show total knee arthroplasty with no acute findings.  There is no resurfacing of the patella.  XR Lumbar Spine 2-3 Views  Result Date: 05/19/2020 2 views of the lumbar spine  show a spondylolisthesis between L4 and L5 that is grade 1.  There is otherwise no acute findings.    PMFS History: Patient Active Problem List   Diagnosis Date Noted  . Status post total replacement of right hip 03/21/2020  . Unilateral primary osteoarthritis, right hip 01/09/2020   Past Medical History:  Diagnosis Date  . Anxiety   . CKD (chronic kidney disease)   . Depression   . DVT, lower extremity (HCC) 2019   right leg  . Headache    Migraines - years ago  . History of kidney stones    passed  . Hyperlipemia   . Hypertension   . Lymphedema   . Osteoarthritis   . Sleep apnea   . Vertigo     History reviewed. No pertinent family history.  Past Surgical History:  Procedure Laterality Date  . BREAST SURGERY Right    Biospy  . EYE SURGERY Bilateral    Catartact  . JOINT REPLACEMENT     knees  . TOTAL HIP ARTHROPLASTY Right 03/21/2020   Procedure: RIGHT TOTAL HIP ARTHROPLASTY ANTERIOR APPROACH;  Surgeon: Kathryne Hitch, MD;  Location: [facility] OR;  Service: Orthopedics;  Laterality: Right;   Social History   Occupational History  . Not on file  Tobacco Use  . Smoking status: Former Smoker    Years: 10.00    Types: Cigarettes    Quit date: 1988    Years since quitting: 33.9  . Smokeless tobacco: Never Used  Vaping Use  . Vaping Use: Never used  Substance and Sexual Activity  . Alcohol use: Not Currently  . Drug use: Never  . Sexual activity: Not on file

## 2020-05-20 ENCOUNTER — Other Ambulatory Visit: Payer: Self-pay | Admitting: Physician Assistant

## 2020-05-20 ENCOUNTER — Telehealth: Payer: Self-pay | Admitting: Orthopaedic Surgery

## 2020-05-20 ENCOUNTER — Other Ambulatory Visit: Payer: Self-pay

## 2020-05-20 NOTE — Telephone Encounter (Signed)
Patient daughter called. Would like to talk to Dr. Magnus Ivan about the plan of care for her mom. Her call back number is [phone]

## 2020-05-21 ENCOUNTER — Telehealth: Payer: Self-pay

## 2020-05-21 NOTE — Telephone Encounter (Signed)
This is a CB pt 

## 2020-05-21 NOTE — Telephone Encounter (Signed)
jojo form [facility] called she stated she is confirming Dr.Duda is putting patient care on hold CB:[phone]

## 2020-05-22 NOTE — Telephone Encounter (Signed)
Sent message to tiffany at [facility] since the number that was written down isn't working

## 2020-05-26 ENCOUNTER — Other Ambulatory Visit: Payer: Self-pay

## 2020-05-26 ENCOUNTER — Encounter (HOSPITAL_COMMUNITY): Payer: Self-pay

## 2020-05-26 ENCOUNTER — Encounter (HOSPITAL_COMMUNITY): Payer: Self-pay | Admitting: Orthopaedic Surgery

## 2020-05-26 ENCOUNTER — Encounter (HOSPITAL_COMMUNITY)
Admission: RE | Admit: 2020-05-26 | Discharge: 2020-05-26 | Disposition: A | Discharge status: Home / Self Care | Payer: Medicare Other | Source: Ambulatory Visit | Attending: Orthopaedic Surgery | Admitting: Orthopaedic Surgery

## 2020-05-26 ENCOUNTER — Other Ambulatory Visit (HOSPITAL_COMMUNITY)
Admission: RE | Admit: 2020-05-26 | Discharge: 2020-05-26 | Disposition: A | Discharge status: Home / Self Care | Payer: Medicare Other | Source: Ambulatory Visit | Attending: Orthopaedic Surgery | Admitting: Orthopaedic Surgery

## 2020-05-26 DIAGNOSIS — E669 Obesity, unspecified: Secondary | ICD-10-CM | POA: Insufficient documentation

## 2020-05-26 DIAGNOSIS — Z86718 Personal history of other venous thrombosis and embolism: Secondary | ICD-10-CM | POA: Insufficient documentation

## 2020-05-26 DIAGNOSIS — T84031A Mechanical loosening of internal left hip prosthetic joint, initial encounter: Secondary | ICD-10-CM | POA: Insufficient documentation

## 2020-05-26 DIAGNOSIS — I129 Hypertensive chronic kidney disease with stage 1 through stage 4 chronic kidney disease, or unspecified chronic kidney disease: Secondary | ICD-10-CM | POA: Insufficient documentation

## 2020-05-26 DIAGNOSIS — Z96649 Presence of unspecified artificial hip joint: Secondary | ICD-10-CM

## 2020-05-26 DIAGNOSIS — Z01812 Encounter for preprocedural laboratory examination: Secondary | ICD-10-CM | POA: Insufficient documentation

## 2020-05-26 DIAGNOSIS — Z79899 Other long term (current) drug therapy: Secondary | ICD-10-CM | POA: Insufficient documentation

## 2020-05-26 DIAGNOSIS — Z7982 Long term (current) use of aspirin: Secondary | ICD-10-CM | POA: Insufficient documentation

## 2020-05-26 DIAGNOSIS — F419 Anxiety disorder, unspecified: Secondary | ICD-10-CM | POA: Insufficient documentation

## 2020-05-26 DIAGNOSIS — Z6836 Body mass index (BMI) 36.0-36.9, adult: Secondary | ICD-10-CM | POA: Insufficient documentation

## 2020-05-26 DIAGNOSIS — E785 Hyperlipidemia, unspecified: Secondary | ICD-10-CM | POA: Insufficient documentation

## 2020-05-26 DIAGNOSIS — Z87891 Personal history of nicotine dependence: Secondary | ICD-10-CM | POA: Insufficient documentation

## 2020-05-26 DIAGNOSIS — F329 Major depressive disorder, single episode, unspecified: Secondary | ICD-10-CM | POA: Insufficient documentation

## 2020-05-26 DIAGNOSIS — N189 Chronic kidney disease, unspecified: Secondary | ICD-10-CM | POA: Insufficient documentation

## 2020-05-26 DIAGNOSIS — Z20822 Contact with and (suspected) exposure to covid-19: Secondary | ICD-10-CM | POA: Insufficient documentation

## 2020-05-26 DIAGNOSIS — T84018D Broken internal joint prosthesis, other site, subsequent encounter: Secondary | ICD-10-CM

## 2020-05-26 DIAGNOSIS — G4733 Obstructive sleep apnea (adult) (pediatric): Secondary | ICD-10-CM | POA: Insufficient documentation

## 2020-05-26 HISTORY — DX: Presence of unspecified artificial hip joint: Z96.649

## 2020-05-26 HISTORY — DX: Broken internal joint prosthesis, other site, subsequent encounter: T84.018D

## 2020-05-26 LAB — BASIC METABOLIC PANEL
Anion gap: 14 (ref 5–15)
BUN: 15 mg/dL (ref 8–23)
CO2: 22 mmol/L (ref 22–32)
Calcium: 10 mg/dL (ref 8.9–10.3)
Chloride: 100 mmol/L (ref 98–111)
Creatinine, Ser: 1.39 mg/dL — ABNORMAL HIGH (ref 0.44–1.00)
GFR, Estimated: 41 mL/min — ABNORMAL LOW (ref >60)
Glucose, Bld: 98 mg/dL (ref 70–99)
Potassium: 4.2 mmol/L (ref 3.5–5.1)
Sodium: 136 mmol/L (ref 135–145)

## 2020-05-26 LAB — CBC
HCT: 35.9 % — ABNORMAL LOW (ref 36.0–46.0)
Hemoglobin: 11.3 g/dL — ABNORMAL LOW (ref 12.0–15.0)
MCH: 28.3 pg (ref 26.0–34.0)
MCHC: 31.5 g/dL (ref 30.0–36.0)
MCV: 89.8 fL (ref 80.0–100.0)
Platelets: 603 10*3/uL — ABNORMAL HIGH (ref 150–400)
RBC: 4 MIL/uL (ref 3.87–5.11)
RDW: 14.7 % (ref 11.5–15.5)
WBC: 12.6 10*3/uL — ABNORMAL HIGH (ref 4.0–10.5)
nRBC: 0 % (ref 0.0–0.2)

## 2020-05-26 LAB — SURGICAL PCR SCREEN
MRSA, PCR: NEGATIVE
Staphylococcus aureus: NEGATIVE

## 2020-05-26 NOTE — Progress Notes (Addendum)
[facility] - [place], [place] - [address] [place] [address] Phone: [phone] Fax: [phone]      Your procedure is scheduled on Tuesday, December 21st.  Report to [facility] Main Entrance "A" at 2:30 PM, and check in at the Admitting office.  Call this number if you have problems the morning of surgery:  [phone]  Call [phone] if you have any questions prior to your surgery date Monday-Friday 8am-4pm    Remember:  Do not eat after midnight the night before your surgery  You may drink clear liquids until 1:30 PM the afternoon of your surgery.   Clear liquids allowed are: Water, Non-Citrus Juices (without pulp), Carbonated Beverages, Clear Tea, Black Coffee Only, and Gatorade  Finish your pre-surgery Ensure by 1:30 pm the afternoon of surgery.  Nothing else to drink after you finish the Ensure.     Take these medicines the morning of surgery with A SIP OF WATER   Tylenol - if needed  Atorvastatin (Lipitor)  Duloxetine (Cymbalta)  Gabapentin (Neurontin)  Zofran - if needed  Oxycodone-Acetaminophen (Percocet) - if needed    As of today, STOP taking any Aspirin (unless otherwise instructed by your surgeon) Aleve, Naproxen, Ibuprofen, Motrin, Advil, Goody's, BC's, all herbal medications, fish oil, and all vitamins.                      Do not wear jewelry, make up, or nail polish            Do not wear lotions, powders, perfumes, or deodorant.            Do not shave 48 hours prior to surgery.              Do not bring valuables to the hospital.            [facility] is not responsible for any belongings or valuables.  Do NOT Smoke (Tobacco/Vaping) or drink Alcohol 24 hours prior to your procedure If you use a CPAP at night, you may bring all equipment for your overnight stay.   Contacts, glasses, dentures or bridgework may not be worn into surgery.      For patients admitted to the hospital, discharge time will  be determined by your treatment team.   Patients discharged the day of surgery will not be allowed to drive home, and someone needs to stay with them for 24 hours.    Special instructions:   [facility]- Preparing For Surgery  Before surgery, you can play an important role. Because skin is not sterile, your skin needs to be as free of germs as possible. You can reduce the number of germs on your skin by washing with CHG (chlorahexidine gluconate) Soap before surgery.  CHG is an antiseptic cleaner which kills germs and bonds with the skin to continue killing germs even after washing.    Oral Hygiene is also important to reduce your risk of infection.  Remember - BRUSH YOUR TEETH THE MORNING OF SURGERY WITH YOUR REGULAR TOOTHPASTE  Please do not use if you have an allergy to CHG or antibacterial soaps. If your skin becomes reddened/irritated stop using the CHG.  Do not shave (including legs and underarms) for at least 48 hours prior to first CHG shower. It is OK to shave your face.  Please follow these instructions carefully.   1. Shower the NIGHT BEFORE SURGERY and the MORNING OF  SURGERY with CHG Soap.   2. If you chose to wash your hair, wash your hair first as usual with your normal shampoo.  3. After you shampoo, rinse your hair and body thoroughly to remove the shampoo.  4. Use CHG as you would any other liquid soap. You can apply CHG directly to the skin and wash gently with a scrungie or a clean washcloth.   5. Apply the CHG Soap to your body ONLY FROM THE NECK DOWN.  Do not use on open wounds or open sores. Avoid contact with your eyes, ears, mouth and genitals (private parts). Wash Face and genitals (private parts)  with your normal soap.   6. Wash thoroughly, paying special attention to the area where your surgery will be performed.  7. Thoroughly rinse your body with warm water from the neck down.  8. DO NOT shower/wash with your normal soap after using and rinsing off the  CHG Soap.  9. Pat yourself dry with a CLEAN TOWEL.  10. Wear CLEAN PAJAMAS to bed the night before surgery  11. Place CLEAN SHEETS on your bed the night of your first shower and DO NOT SLEEP WITH PETS.   Day of Surgery: Wear Clean/Comfortable clothing the morning of surgery Do not apply any deodorants/lotions.   Remember to brush your teeth WITH YOUR REGULAR TOOTHPASTE.   Please read over the following fact sheets that you were given.

## 2020-05-26 NOTE — Progress Notes (Signed)
Anesthesia Chart Review:  Case: [id number] Date/Time: 05/27/20 1612   Procedure: REVISION ACETABULAR COMPONENT RIGHT HIP (Right Hip)   Anesthesia type: Choice   Pre-op diagnosis: failed acetabular component right hip   [facility] ROOM 04 / [facility] OR   Surgeons: Kathryne Hitch, MD      DISCUSSION: Patient is a 69 year old female scheduled for the above procedure. She is s/p right THA 03/21/20.   History includes former smoker (quit 06/07/86), HTN, HLD, CKD (Cr 1.1-1.9 since ~ 2019 per Dr. Kathaleen Bury), OSA (severe 12/2018 sleep study, not using CPAP), RLE DVT (post-TKA 2019), migraines, anxiety, depression, vertigo, lymphedema (BLE), TKA (left 08/17/16, right 09/06/17), right THA (03/21/20). BMI is consistent with obesity.   Creatinine 1.39.  She denied shortness of breath, cough, fever, chest pain at PAT RN visit.  She tolerated right THA just over 2 months ago.  Preoperative COVID-19 test from 05/26/20 is in process. Anesthesia team to evaluate on the day of surgery.    VS: BP 126/68   Pulse 96   Temp 36.8 C (Oral)   Resp 19   Ht 5\' 5"  (1.651 m)   Wt 99.8 kg   SpO2 100%   BMI 36.61 kg/m    PROVIDERS: , MD is PCP [facility]) - She received pain management at [facility] by [facility], PA-C.   LABS: Labs reviewed: Acceptable for surgery. (all labs ordered are listed, but only abnormal results are displayed)  Labs Reviewed  BASIC METABOLIC PANEL - Abnormal; Notable for the following components:      Result Value   Creatinine, Ser 1.39 (*)    GFR, Estimated 41 (*)    All other components within normal limits  CBC - Abnormal; Notable for the following components:   WBC 12.6 (*)    Hemoglobin 11.3 (*)    HCT 35.9 (*)    Platelets 603 (*)    All other components within normal limits  SURGICAL PCR SCREEN  TYPE AND SCREEN    OTHER:  - [facility] 12[facility]/18 [facility]): ASSESSMENT: Severe obstructive sleep apneawith an AHI of 81.1 events/hr, arousal index of 57.9 per hour and a low saturation of 64.0%. There were moderate PLMS with an index of 41.8 /hr and PLMS arousal index of 0.8 /hr. EKG indicates normal sinusrhythm. EEG is normal.  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 There were no PLMS with an index of 0.0 /hr and PLMS arousal index of 0.0 /hr. EKG indicates normal sinus rhythm. EEG is normal. RECOMMENDATIONS: A titration of VPAP with ASV is recommended for this patient with complex apnea syndrome poorly tolerant of CPAP and Bilevel. If the patient is poorly tolerant to PAP, further anatomic evaluation for possible surgical intervention may also be considered...   EKG: 03/18/20:  Normal sinus rhythm Low voltage QRS Poor R wave progression Cannot rule out Anterior infarct , age undetermined Abnormal ECG No old tracing to compare Confirmed by 05/18/20 (Donato Schultz) on 03/18/2020 8:29:50 PM   CV: Denied prior stress test, echocardiogram, cardiac cath   Past Medical History:  Diagnosis Date  . Anxiety   . CKD (chronic kidney disease)   . Depression   . DVT, lower extremity (HCC) 2019   right leg  . Headache    Migraines - years ago  .  History of kidney stones    passed  . Hyperlipemia   . Hypertension   . Lymphedema   . Osteoarthritis   . Sleep apnea   . Vertigo     Past Surgical History:  Procedure Laterality Date  . APPENDECTOMY    . BREAST SURGERY Right    Biospy  . EYE SURGERY Bilateral    Catartact  . JOINT REPLACEMENT     knees  . TOTAL HIP ARTHROPLASTY Right 03/21/2020   Procedure: RIGHT TOTAL HIP ARTHROPLASTY ANTERIOR APPROACH;  Surgeon: Kathryne Hitch, MD;  Location: [facility] OR;  Service: Orthopedics;  Laterality: Right;     MEDICATIONS: . acetaminophen (TYLENOL) 500 MG tablet  . aspirin EC 81 MG tablet  . atorvastatin (LIPITOR) 20 MG tablet  . candesartan (ATACAND) 16 MG tablet  . cholecalciferol (VITAMIN D3) 25 MCG (1000 UNIT) tablet  . docusate sodium (COLACE) 100 MG capsule  . doxycycline (VIBRA-TABS) 100 MG tablet  . DULoxetine (CYMBALTA) 60 MG capsule  . furosemide (LASIX) 40 MG tablet  . gabapentin (NEURONTIN) 300 MG capsule  . lansoprazole (PREVACID) 30 MG capsule  . methocarbamol (ROBAXIN) 500 MG tablet  . ondansetron (ZOFRAN ODT) 4 MG disintegrating tablet  . oxyCODONE-acetaminophen (PERCOCET) 7.5-325 MG tablet  . rOPINIRole (REQUIP) 1 MG tablet  . spironolactone (ALDACTONE) 25 MG tablet   No current facility-administered medications for this encounter.    Shonna Chock, PA-C Surgical Short Stay/Anesthesiology [facility] Phone [phone] [facility] Phone [phone] 05/26/2020 3:24 PM

## 2020-05-26 NOTE — Progress Notes (Addendum)
PCP - Elenor Quinones Cardiologist - denies  Chest x-ray - n/a EKG - 03-18-20  SA - yes, does not wear CPAP  ERAS Protcol - yes, Ensure given  COVID TEST- 05-26-20   Anesthesia review: n/a Revonda Standard notified of abnormal EKG, which was taken at patient's last surgery.  Revonda Standard stated she would review her chart, but do not need to send to anesthesia.    Patient denies shortness of breath, fever, cough and chest pain at PAT appointment   All instructions explained to the patient, with a verbal understanding of the material. Patient agrees to go over the instructions while at home for a better understanding. Patient also instructed to self quarantine after being tested for COVID-19. The opportunity to ask questions was provided.

## 2020-05-26 NOTE — H&P (Signed)
Monica Serrano is an 69 y.o. female.   Chief Complaint: right hip pain; previous right total hip HPI: The patient is a very pleasant 69 year old female well-known to me.  She underwent a right total hip arthroplasty on October 15 of this year.  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  Even when I saw her in the office at her first visit she was barely able to ambulate well holding onto things as she mobilized around my exam room.  Her pain was quite severe as well.  She was a significant fall risk as well.  We recommended a total hip arthroplasty through direct anterior approach to treat the severe arthritis.  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  Her main comorbidity is her weight being morbidly obese with a BMI of almost 40.  She is actually lost over 35 pounds when she came to see me.  With that being said, she still has very large thighs and significant truncal obesity.  With that being said, I was still comfortable with proceeding with surgery knowing that she is a very motivated individual and would likely continue to lose weight when she has her hip replaced.  The surgery went very well on October 15 and I saw her in the office 2 weeks later.  She reported at her 2-week visit that she was doing well with her mobility and had appropriate postoperative pain.  I was concerned about a large seroma and I aspirated 200 cc of fluid off her hip and she had developed some breakdown of her incision secondary to her obesity.  This was successfully treated with local wound care and with VAC placement and she was doing well until a few weeks ago when she developed significant worsening right lower extremity pain and difficulty with ambulating again.  I saw her in the office and we felt that this was most likely a hip issue and not so much her knee but we x-rayed both her knee, her hip and her back.  The  x-rays of her hip showed a change in position of the acetabular component in spite of even placing 2 screws.  I reviewed my operative note and did not see any issues as related to her initial surgery and intraoperative and immediate postoperative films showed good placement last upper component.  However, this is changed and at this point she is in need of revision of the acetabular component.  Her right hip soft tissue issues have improved significantly.  This does not appear to be an infection.  Past Medical History:  Diagnosis Date  . Anxiety   . CKD (chronic kidney disease)   . Depression   . DVT, lower extremity (HCC) 2019   right leg  . 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 Headache    Migraines - years ago  . History of kidney stones    passed  . Hyperlipemia   . Hypertension   . Lymphedema   . Osteoarthritis   . Sleep apnea   . Vertigo     Past Surgical History:  Procedure Laterality Date  . APPENDECTOMY    . BREAST SURGERY Right    Biospy  . EYE SURGERY Bilateral    Catartact  . JOINT REPLACEMENT     knees  . TOTAL HIP ARTHROPLASTY Right 03/21/2020   Procedure: RIGHT TOTAL HIP ARTHROPLASTY ANTERIOR APPROACH;  Surgeon: Kathryne Hitch, MD;  Location: [facility] OR;  Service: Orthopedics;  Laterality: Right;    No family history on file. Social History:  reports that she quit smoking about 33 years ago. Her smoking use included cigarettes. She quit after 10.00 years of use. She has never used smokeless tobacco. She reports previous alcohol use. She reports that she does not use drugs.  Allergies: No Known Allergies  No medications prior to admission.    Results for orders placed or performed during the hospital encounter of 05/26/20 (from the past 48 hour(s))  Surgical pcr screen     Status: None   Collection Time: 05/26/20  1:29 PM   Specimen: Nasal Mucosa; Nasal Swab  Result Value Ref Range   MRSA, PCR NEGATIVE NEGATIVE   Staphylococcus aureus  NEGATIVE NEGATIVE    Comment: (NOTE) The Xpert SA Assay (FDA approved for NASAL specimens in patients 70 years of age and older), is one component of a comprehensive surveillance program. It is not intended to diagnose infection nor to guide or monitor treatment. Performed at [facility], [address] [place], [place] [address]   Type and screen Order type and screen if day of surgery is less than 15 days from draw of preadmission visit or order morning of surgery if day of surgery is greater than 6 days from preadmission visit.     Status: None   Collection Time: 05/26/20  1:51 PM  Result Value Ref Range   ABO/RH(D) B POS    Antibody Screen NEG    Sample Expiration 06/09/2020,2359    Extend sample reason      NO TRANSFUSIONS OR PREGNANCY IN THE PAST 3 MONTHS Performed at [facility], [address] [place], [place] [address]   Basic metabolic panel per protocol     Status: Abnormal   Collection Time: 05/26/20  2:55 PM  Result Value Ref Range   Sodium 136 135 - 145 mmol/L   Potassium 4.2 3.5 - 5.1 mmol/L   Chloride 100 98 - 111 mmol/L   CO2 22 22 - 32 mmol/L   Glucose, Bld 98 70 - 99 mg/dL    Comment: Glucose reference range applies only to samples taken after fasting for at least 8 hours.   BUN 15 8 - 23 mg/dL   Creatinine, Ser 0.98 (H) 0.44 - 1.00 mg/dL   Calcium 11.9 8.9 - 14.7 mg/dL   GFR, Estimated 41 (L) >60 mL/min    Comment: (NOTE) Calculated using the CKD-EPI Creatinine Equation (2021)    Anion gap 14 5 - 15    Comment: Performed at [facility], [address] [place], [place] [address]  CBC per protocol     Status: Abnormal   Collection Time: 05/26/20  2:55 PM  Result Value Ref Range   WBC 12.6 (H) 4.0 - 10.5 K/uL   RBC 4.00 3.87 - 5.11 MIL/uL   Hemoglobin 11.3 (L) 12.0 - 15.0 g/dL   HCT 21.3 (L) 08.6 - 57.8 %   MCV 89.8 80.0 - 100.0 fL   MCH 28.3 26.0 - 34.0 pg   MCHC 31.5 30.0 - 36.0 g/dL   RDW 46.9 62.9 - 52.8 %   Platelets 603  (H) 150 - 400 K/uL   nRBC 0.0 0.0 - 0.2 %    Comment: Performed at [facility], [address], [place], [place] [address]   No results found.  Review of Systems  All other  systems reviewed and are negative.   There were no vitals taken for this visit. Physical Exam Vitals reviewed.  Constitutional:      Appearance: Normal appearance.  Eyes:     Extraocular Movements: Extraocular movements intact.     Pupils: Pupils are equal, round, and reactive to light.  Cardiovascular:     Rate and Rhythm: Normal rate and regular rhythm.  Pulmonary:     Effort: Pulmonary effort is normal.     Breath sounds: Normal breath sounds.  Abdominal:     General: Abdomen is flat.  Musculoskeletal:     Cervical back: Normal range of motion and neck supple.     Right hip: Tenderness and bony tenderness present. Decreased range of motion. Decreased strength.  Neurological:     Mental Status: She is alert and oriented to person, place, and time.  Psychiatric:        Behavior: Behavior normal.      Assessment/Plan Right hip with failed/loose acetabular component status post total hip arthroplasty  My plan is to proceed to surgery for revision of her acetabular component.  I will assess the bone quality intraoperatively and will likely need to upsize a component and placed more screws to secure this.  We will assess her soft tissue again at the time and thoroughly irrigate out the hip joint changing out her hip ball and polyethylene liner as well.  My goal is to allow her full weightbearing as tolerated after surgery and watch her wound closely to hopefully not develop a dehiscence like what occurred previously.  I will likely put an incisional VAC on her incision postoperatively and keep her in the hospital few days.  She will likely need to be discharged to skilled nursing post her hospital stay.  The risk and benefits of surgery have been explained in detail.  Kathryne Hitch,  MD 05/26/2020, 7:43 PM

## 2020-05-26 NOTE — Anesthesia Preprocedure Evaluation (Addendum)
Anesthesia Evaluation  Patient identified by MRN, date of birth, ID band Patient awake    Reviewed: Allergy & Precautions, NPO status , Patient's Chart, lab work & pertinent test results  Airway Mallampati: I  TM Distance: >3 FB Neck ROM: Full    Dental no notable dental hx. (+) Edentulous Upper, Dental Advisory Given   Pulmonary sleep apnea , former smoker,    Pulmonary exam normal breath sounds clear to auscultation       Cardiovascular hypertension, Pt. on medications + DVT  Normal cardiovascular exam Rhythm:Regular Rate:Normal     Neuro/Psych Anxiety    GI/Hepatic Neg liver ROS,   Endo/Other  negative endocrine ROS  Renal/GU Renal InsufficiencyRenal disease     Musculoskeletal  (+) Arthritis ,   Abdominal (+) + obese,   Peds  Hematology negative hematology ROS (+)   Anesthesia Other Findings NKDA  Reproductive/Obstetrics                            Anesthesia Physical Anesthesia Plan  ASA: II  Anesthesia Plan: Spinal   Post-op Pain Management:    Induction: Intravenous  PONV Risk Score and Plan: Treatment may vary due to age or medical condition, Ondansetron and Dexamethasone  Airway Management Planned: Nasal Cannula and Natural Airway  Additional Equipment: None  Intra-op Plan:   Post-operative Plan:   Informed Consent: I have reviewed the patients History and Physical, chart, labs and discussed the procedure including the risks, benefits and alternatives for the proposed anesthesia with the patient or authorized representative who has indicated his/her understanding and acceptance.     Dental advisory given  Plan Discussed with: CRNA and Anesthesiologist  Anesthesia Plan Comments: (PAT note written 05/26/2020 by Shonna Chock, PA-C. )       Anesthesia Quick Evaluation

## 2020-05-27 ENCOUNTER — Inpatient Hospital Stay (HOSPITAL_COMMUNITY): Payer: Medicare Other

## 2020-05-27 ENCOUNTER — Inpatient Hospital Stay (HOSPITAL_COMMUNITY)
Admission: RE | Admit: 2020-05-27 | Discharge: 2020-06-02 | DRG: 467 | Disposition: A | Discharge status: Skilled Nursing Facility | Payer: Medicare Other | Attending: Orthopaedic Surgery | Admitting: Orthopaedic Surgery

## 2020-05-27 ENCOUNTER — Inpatient Hospital Stay (HOSPITAL_COMMUNITY): Payer: Medicare Other | Admitting: Anesthesiology

## 2020-05-27 ENCOUNTER — Encounter (HOSPITAL_COMMUNITY)
Admission: RE | Disposition: A | Discharge status: Skilled Nursing Facility | Payer: Self-pay | Source: Home / Self Care | Attending: Orthopaedic Surgery

## 2020-05-27 ENCOUNTER — Inpatient Hospital Stay (HOSPITAL_COMMUNITY): Payer: Medicare Other | Admitting: Vascular Surgery

## 2020-05-27 ENCOUNTER — Encounter (HOSPITAL_COMMUNITY): Payer: Self-pay | Admitting: Orthopaedic Surgery

## 2020-05-27 ENCOUNTER — Other Ambulatory Visit: Payer: Self-pay

## 2020-05-27 DIAGNOSIS — T84018D Broken internal joint prosthesis, other site, subsequent encounter: Secondary | ICD-10-CM | POA: Diagnosis not present

## 2020-05-27 DIAGNOSIS — F419 Anxiety disorder, unspecified: Secondary | ICD-10-CM | POA: Diagnosis present

## 2020-05-27 DIAGNOSIS — E785 Hyperlipidemia, unspecified: Secondary | ICD-10-CM | POA: Diagnosis present

## 2020-05-27 DIAGNOSIS — Z20822 Contact with and (suspected) exposure to covid-19: Secondary | ICD-10-CM | POA: Diagnosis present

## 2020-05-27 DIAGNOSIS — I10 Essential (primary) hypertension: Secondary | ICD-10-CM | POA: Diagnosis present

## 2020-05-27 DIAGNOSIS — D62 Acute posthemorrhagic anemia: Secondary | ICD-10-CM | POA: Diagnosis not present

## 2020-05-27 DIAGNOSIS — T84030A Mechanical loosening of internal right hip prosthetic joint, initial encounter: Secondary | ICD-10-CM | POA: Diagnosis present

## 2020-05-27 DIAGNOSIS — E669 Obesity, unspecified: Secondary | ICD-10-CM | POA: Diagnosis present

## 2020-05-27 DIAGNOSIS — T84090A Other mechanical complication of internal right hip prosthesis, initial encounter: Secondary | ICD-10-CM | POA: Diagnosis present

## 2020-05-27 DIAGNOSIS — R42 Dizziness and giddiness: Secondary | ICD-10-CM | POA: Diagnosis present

## 2020-05-27 DIAGNOSIS — Y792 Prosthetic and other implants, materials and accessory orthopedic devices associated with adverse incidents: Secondary | ICD-10-CM | POA: Diagnosis present

## 2020-05-27 DIAGNOSIS — G43909 Migraine, unspecified, not intractable, without status migrainosus: Secondary | ICD-10-CM | POA: Diagnosis present

## 2020-05-27 DIAGNOSIS — N189 Chronic kidney disease, unspecified: Secondary | ICD-10-CM | POA: Diagnosis present

## 2020-05-27 DIAGNOSIS — F32A Depression, unspecified: Secondary | ICD-10-CM | POA: Diagnosis present

## 2020-05-27 DIAGNOSIS — Z87891 Personal history of nicotine dependence: Secondary | ICD-10-CM

## 2020-05-27 DIAGNOSIS — Z86718 Personal history of other venous thrombosis and embolism: Secondary | ICD-10-CM | POA: Diagnosis not present

## 2020-05-27 DIAGNOSIS — Z96649 Presence of unspecified artificial hip joint: Secondary | ICD-10-CM

## 2020-05-27 DIAGNOSIS — Z6836 Body mass index (BMI) 36.0-36.9, adult: Secondary | ICD-10-CM

## 2020-05-27 DIAGNOSIS — Z96641 Presence of right artificial hip joint: Secondary | ICD-10-CM

## 2020-05-27 DIAGNOSIS — G4730 Sleep apnea, unspecified: Secondary | ICD-10-CM | POA: Diagnosis present

## 2020-05-27 DIAGNOSIS — I129 Hypertensive chronic kidney disease with stage 1 through stage 4 chronic kidney disease, or unspecified chronic kidney disease: Secondary | ICD-10-CM | POA: Diagnosis present

## 2020-05-27 DIAGNOSIS — M1612 Unilateral primary osteoarthritis, left hip: Secondary | ICD-10-CM | POA: Diagnosis present

## 2020-05-27 DIAGNOSIS — Z419 Encounter for procedure for purposes other than remedying health state, unspecified: Secondary | ICD-10-CM

## 2020-05-27 HISTORY — PX: ANTERIOR HIP REVISION: SHX6527

## 2020-05-27 LAB — SARS CORONAVIRUS 2 (TAT 6-24 HRS): SARS Coronavirus 2: NEGATIVE

## 2020-05-27 IMAGING — RF DG HIP (WITH PELVIS) OPERATIVE*R*
1 series · 2 of 2 positions shown · non-contrast
Comparison: Hip radiographs [DATE].

CLINICAL DATA: Surgery.

EXAM:
OPERATIVE RIGHT HIP (WITH PELVIS IF PERFORMED) 2 VIEWS
TECHNIQUE: Fluoroscopic spot image(s) were submitted for interpretation
post-operatively.

[Series 1: run · 0.20mm/px · 2 of 2 slices shown]
[im 1/2]
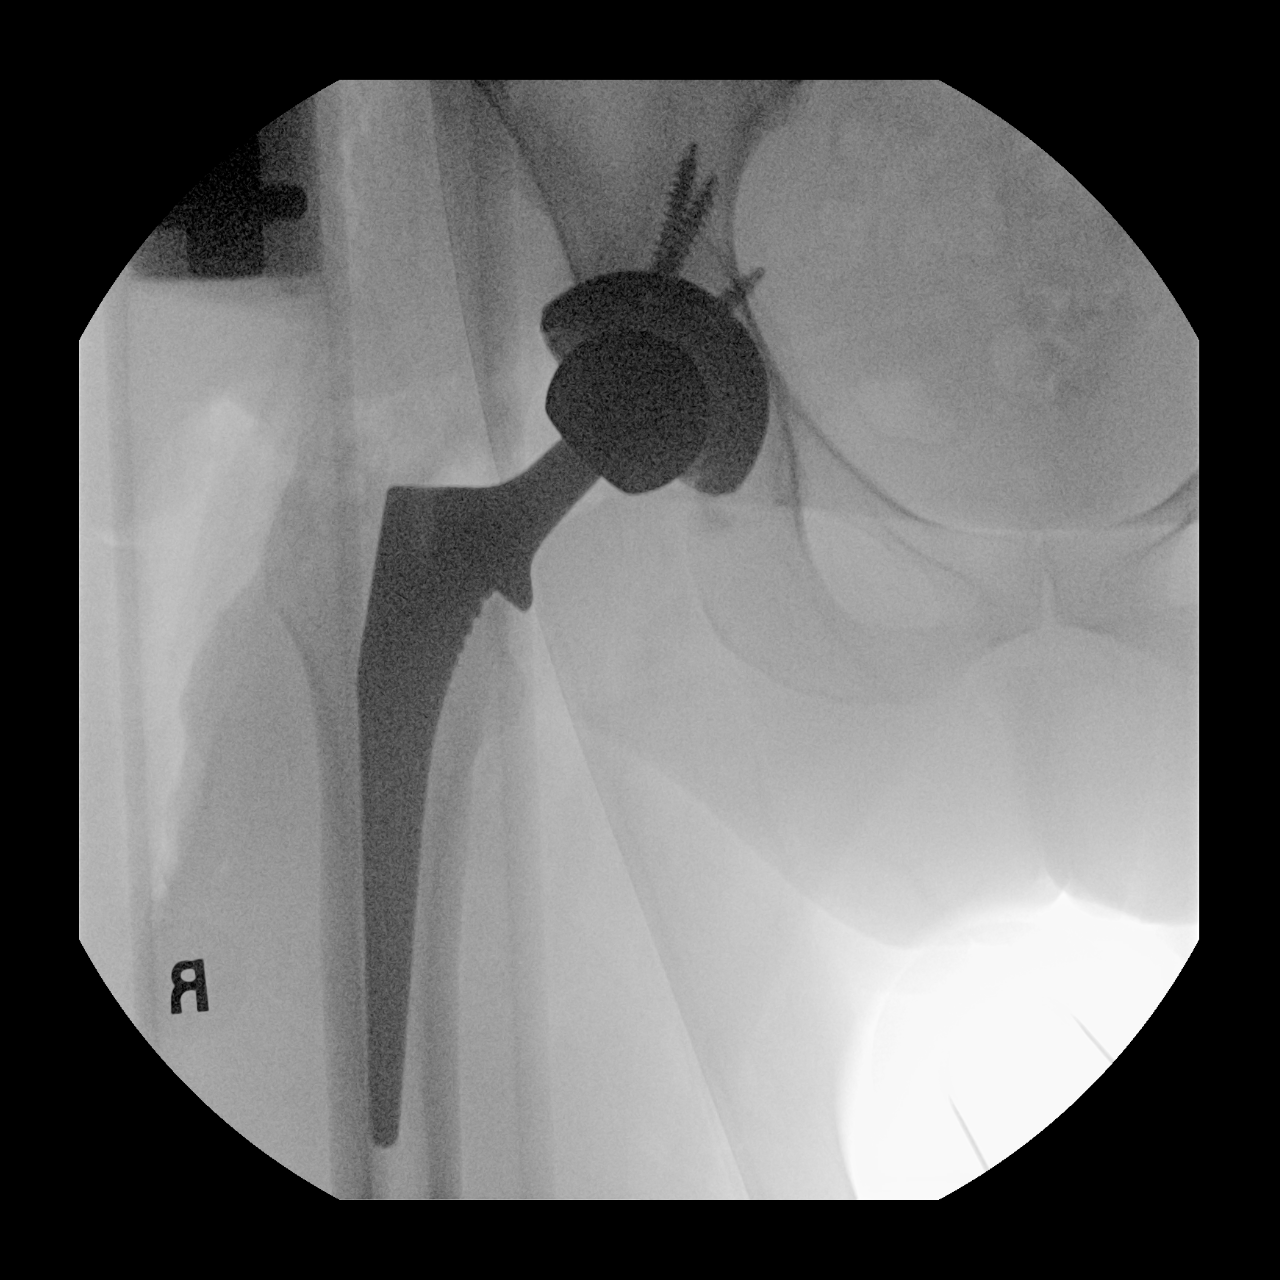
[im 2/2]
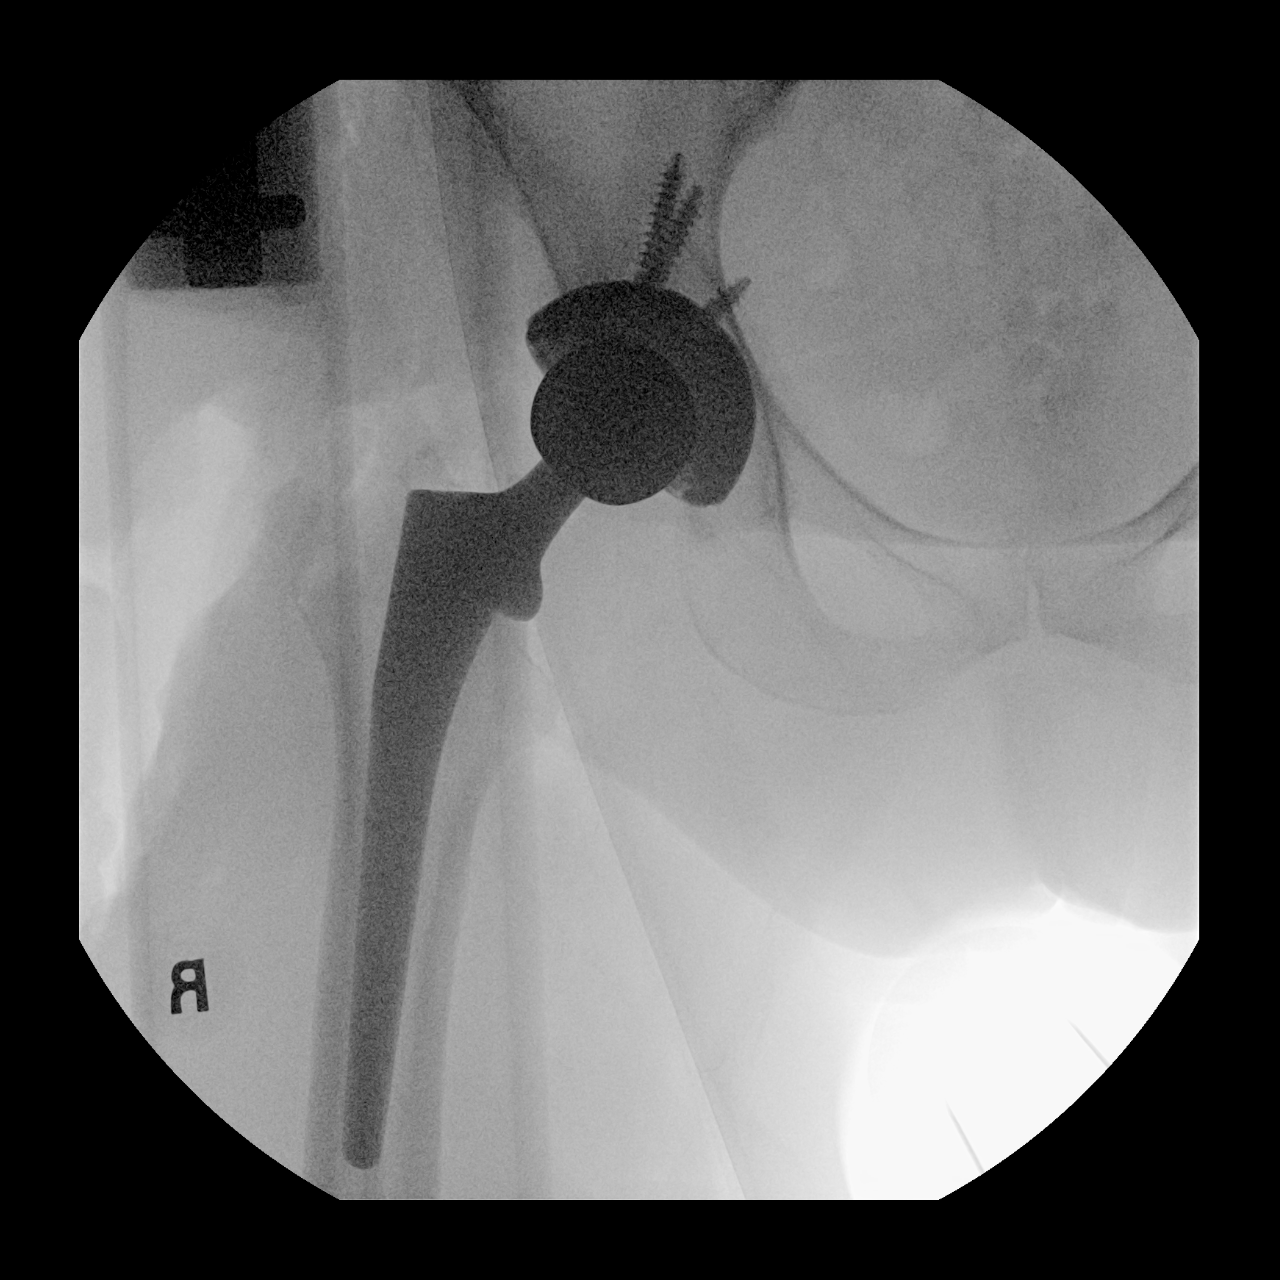

[2 of 2 positions shown; findings below may reference images not displayed]

FINDINGS: Fluoro time: 37.9 seconds.

Radiation: 2.8 mGy.

Two C-arm fluoroscopic images were obtained intraoperatively and
submitted for post operative interpretation. These images
demonstrate postoperative changes of right total hip arthroplasty.
Please see the performing provider's procedural report for further
detail.
IMPRESSION: Intraoperative fluoroscopic images, as detailed above.

## 2020-05-27 IMAGING — DX DG PORTABLE PELVIS
1 series · 1 of 1 positions shown · non-contrast
Comparison: [DATE]

CLINICAL DATA: Right hip arthroplasty

EXAM:
PORTABLE PELVIS 1-2 VIEWS

[pelvis]
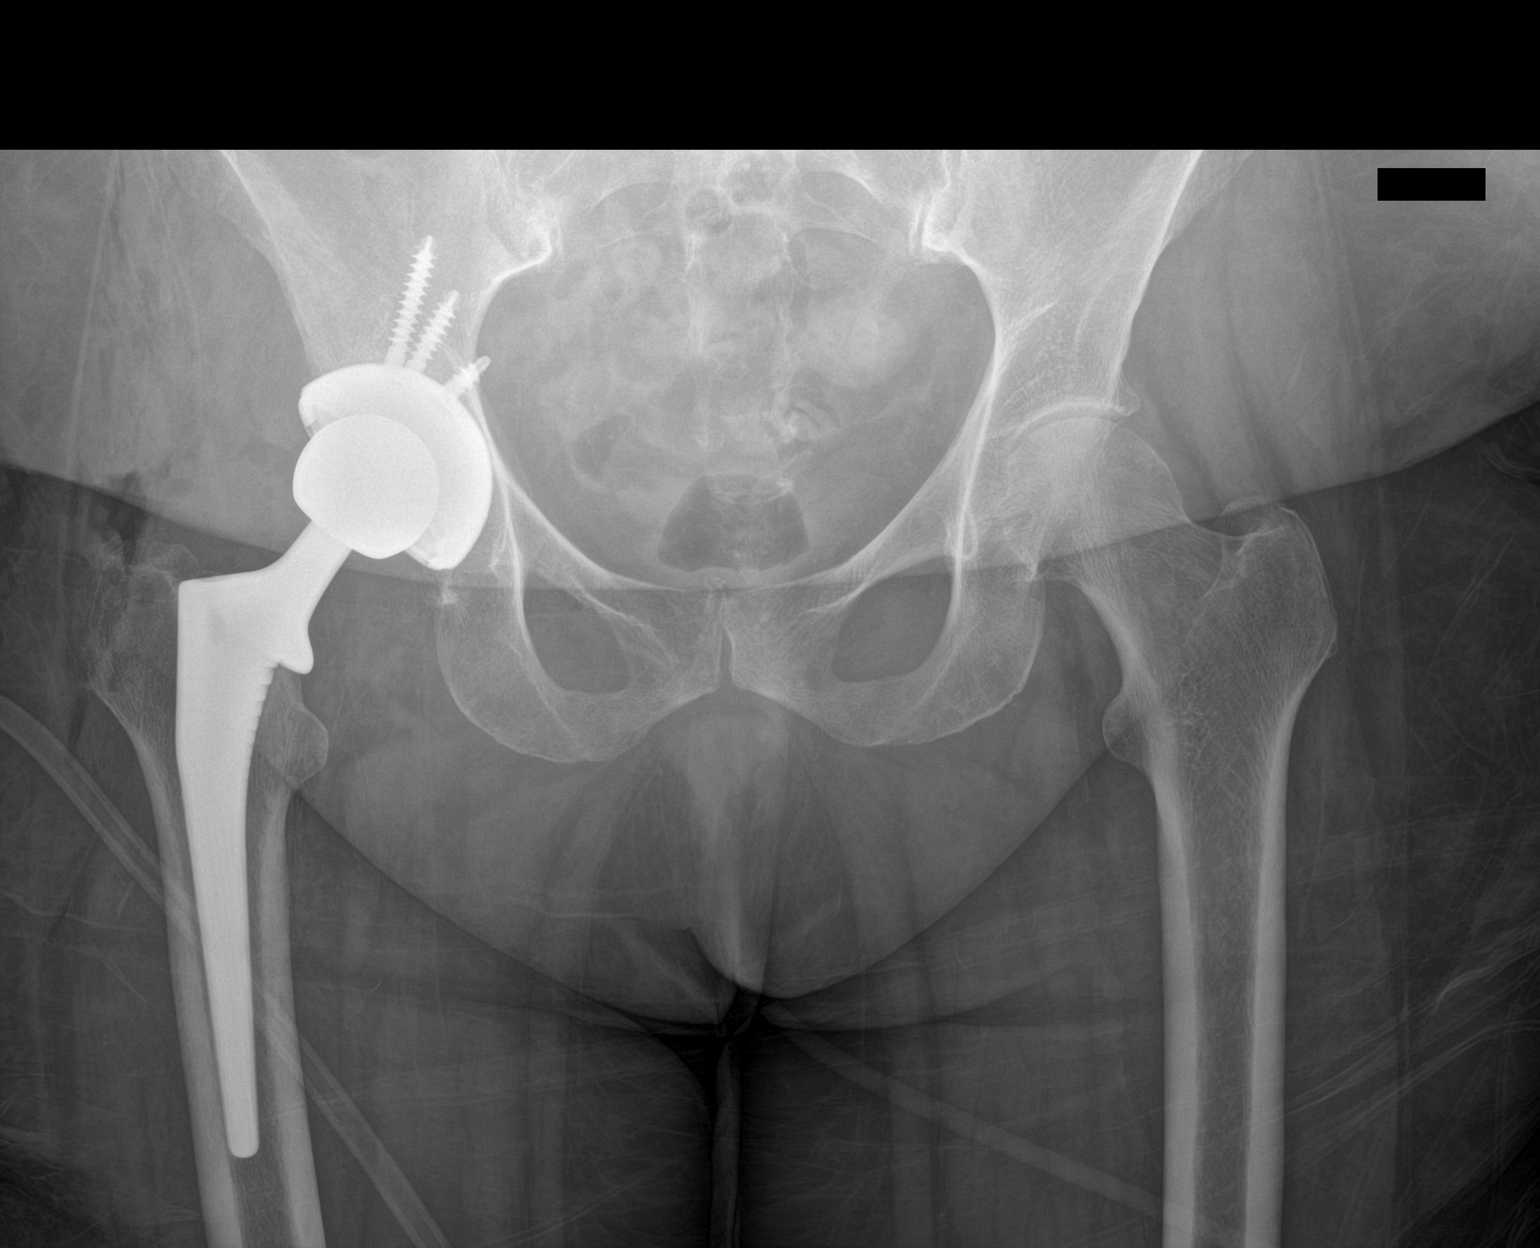

[1 of 1 positions shown; findings below may reference images not displayed]

FINDINGS: Single frontal view of the pelvis demonstrates revision of right hip
arthroplasty in the expected position without signs of acute
complication. Postsurgical changes are seen in the soft tissues.
Stable left hip osteoarthritis.
IMPRESSION: 1. Unremarkable right hip arthroplasty.

## 2020-05-27 IMAGING — RF DG C-ARM 1-60 MIN
1 series · 2 of 2 positions shown · non-contrast
Comparison: Hip radiographs [DATE].

CLINICAL DATA: Surgery.

EXAM:
OPERATIVE RIGHT HIP (WITH PELVIS IF PERFORMED) 2 VIEWS
TECHNIQUE: Fluoroscopic spot image(s) were submitted for interpretation
post-operatively.

[Series 1: run · 0.20mm/px · 2 of 2 slices shown]
[im 1/2]
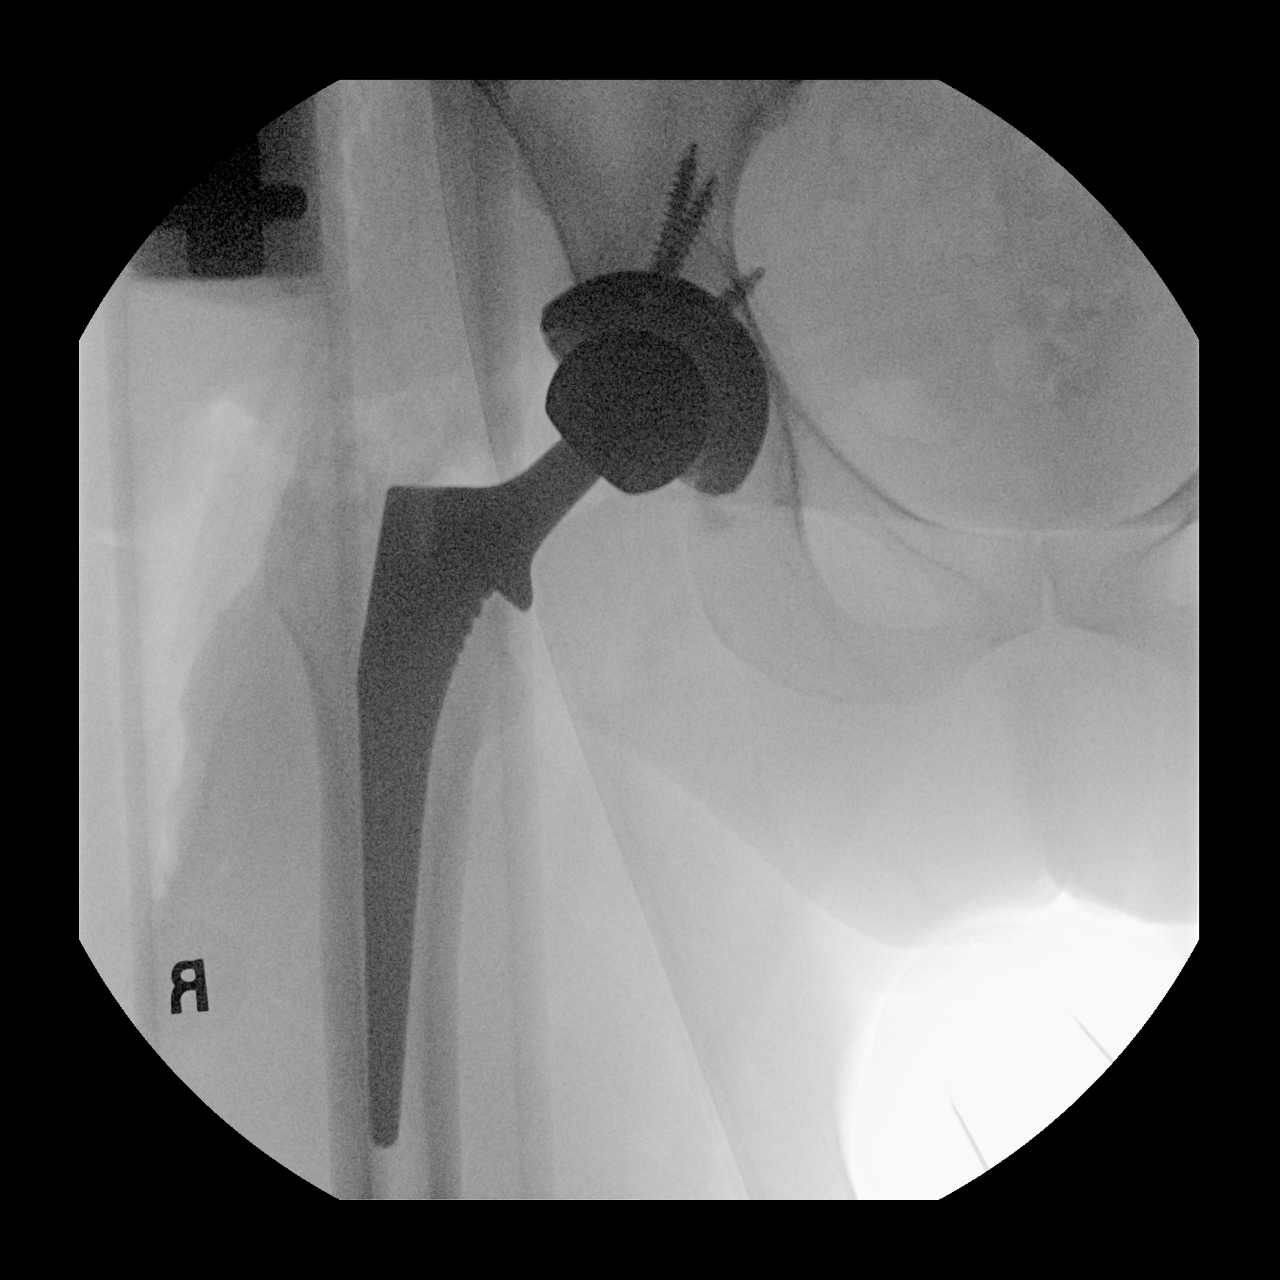
[im 2/2]
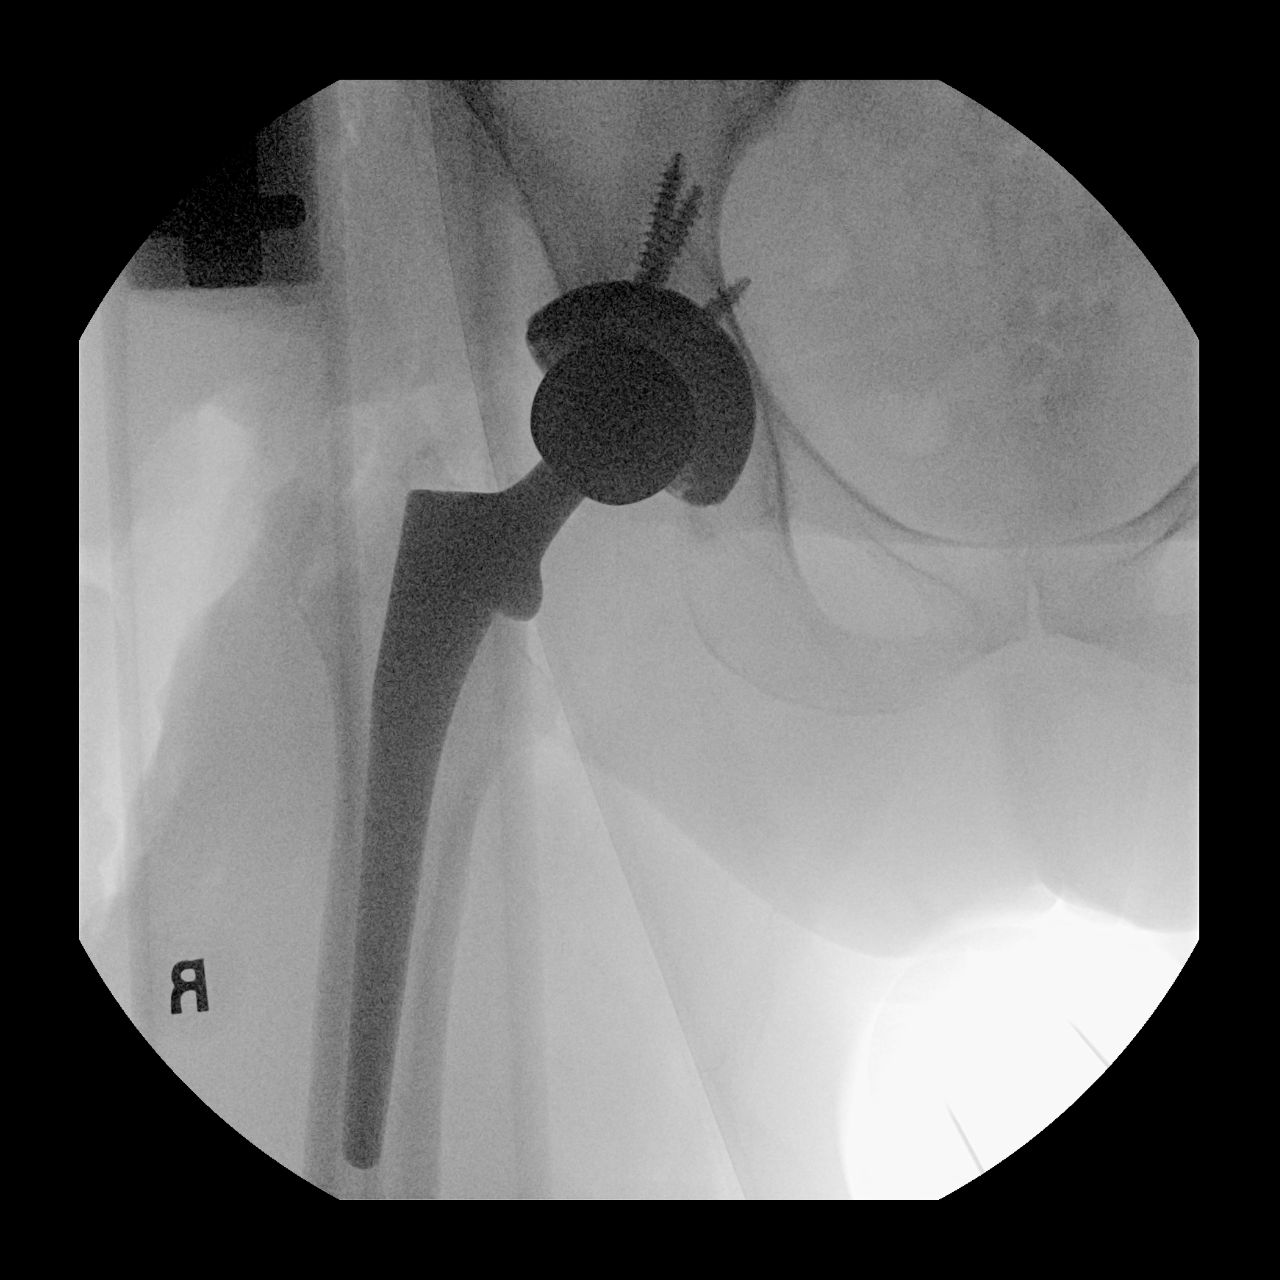

[2 of 2 positions shown; findings below may reference images not displayed]

FINDINGS: Fluoro time: 37.9 seconds.

Radiation: 2.8 mGy.

Two C-arm fluoroscopic images were obtained intraoperatively and
submitted for post operative interpretation. These images
demonstrate postoperative changes of right total hip arthroplasty.
Please see the performing provider's procedural report for further
detail.
IMPRESSION: Intraoperative fluoroscopic images, as detailed above.

## 2020-05-27 SURGERY — REVISION, TOTAL ARTHROPLASTY, HIP, ANTERIOR APPROACH
Anesthesia: Spinal | Site: Hip | Laterality: Right

## 2020-05-27 MED ORDER — MIDAZOLAM HCL 2 MG/2ML IJ SOLN
INTRAMUSCULAR | Status: AC
  Filled 2020-05-27: qty 2

## 2020-05-27 MED ORDER — MEPERIDINE HCL 25 MG/ML IJ SOLN: 6.2500 mg | INTRAMUSCULAR | Status: DC | PRN

## 2020-05-27 MED ORDER — TRANEXAMIC ACID-NACL 1000-0.7 MG/100ML-% IV SOLN
1000.0000 mg | INTRAVENOUS | Status: AC
  Administered 2020-05-27: 15:00:00 1000 mg via INTRAVENOUS

## 2020-05-27 MED ORDER — BUPIVACAINE IN DEXTROSE 0.75-8.25 % IT SOLN
INTRATHECAL | Status: DC | PRN
  Administered 2020-05-27: 12 mg via INTRATHECAL

## 2020-05-27 MED ORDER — BISACODYL 10 MG RE SUPP: 10.0000 mg | Freq: Every day | RECTAL | Status: DC | PRN

## 2020-05-27 MED ORDER — ATORVASTATIN CALCIUM 10 MG PO TABS
20.0000 mg | ORAL_TABLET | Freq: Every day | ORAL | Status: DC
  Administered 2020-05-28 – 2020-06-02 (×6): 20 mg via ORAL
  Filled 2020-05-27 (×6): qty 2

## 2020-05-27 MED ORDER — SPIRONOLACTONE 25 MG PO TABS
25.0000 mg | ORAL_TABLET | Freq: Every day | ORAL | Status: DC
  Administered 2020-05-27 – 2020-06-02 (×7): 25 mg via ORAL
  Filled 2020-05-27 (×8): qty 1

## 2020-05-27 MED ORDER — AMISULPRIDE (ANTIEMETIC) 5 MG/2ML IV SOLN: 10.0000 mg | Freq: Once | INTRAVENOUS | Status: DC | PRN

## 2020-05-27 MED ORDER — VANCOMYCIN HCL 1000 MG IV SOLR
INTRAVENOUS | Status: DC | PRN
  Administered 2020-05-27: 1000 mg

## 2020-05-27 MED ORDER — 0.9 % SODIUM CHLORIDE (POUR BTL) OPTIME
TOPICAL | Status: DC | PRN
  Administered 2020-05-27: 16:00:00 1000 mL

## 2020-05-27 MED ORDER — HYDROMORPHONE HCL 1 MG/ML IJ SOLN
0.2500 mg | INTRAMUSCULAR | Status: DC | PRN
Start: 2020-05-27 — End: 2020-05-27

## 2020-05-27 MED ORDER — CEFAZOLIN SODIUM-DEXTROSE 2-4 GM/100ML-% IV SOLN
2.0000 g | INTRAVENOUS | Status: AC
  Administered 2020-05-27: 15:00:00 2 g via INTRAVENOUS

## 2020-05-27 MED ORDER — TRANEXAMIC ACID-NACL 1000-0.7 MG/100ML-% IV SOLN
INTRAVENOUS | Status: AC
  Filled 2020-05-27: qty 100

## 2020-05-27 MED ORDER — GABAPENTIN 300 MG PO CAPS
300.0000 mg | ORAL_CAPSULE | Freq: Two times a day (BID) | ORAL | Status: DC
  Administered 2020-05-27 – 2020-06-02 (×12): 300 mg via ORAL
  Filled 2020-05-27 (×12): qty 1

## 2020-05-27 MED ORDER — ROPINIROLE HCL 0.5 MG PO TABS
1.0000 mg | ORAL_TABLET | Freq: Every day | ORAL | Status: DC
  Administered 2020-05-27 – 2020-06-01 (×6): 1 mg via ORAL
  Filled 2020-05-27 (×6): qty 2

## 2020-05-27 MED ORDER — ACETAMINOPHEN 10 MG/ML IV SOLN: 1000.0000 mg | Freq: Once | INTRAVENOUS | Status: DC | PRN

## 2020-05-27 MED ORDER — SODIUM CHLORIDE 0.9 % IR SOLN
Status: DC | PRN
  Administered 2020-05-27: 3000 mL

## 2020-05-27 MED ORDER — VITAMIN D 25 MCG (1000 UNIT) PO TABS
2000.0000 [IU] | ORAL_TABLET | Freq: Every day | ORAL | Status: DC
  Administered 2020-05-27 – 2020-06-02 (×7): 2000 [IU] via ORAL
  Filled 2020-05-27 (×7): qty 2

## 2020-05-27 MED ORDER — CEFAZOLIN SODIUM-DEXTROSE 1-4 GM/50ML-% IV SOLN
1.0000 g | Freq: Three times a day (TID) | INTRAVENOUS | Status: DC
  Administered 2020-05-27 – 2020-06-02 (×17): 1 g via INTRAVENOUS
  Filled 2020-05-27 (×17): qty 50

## 2020-05-27 MED ORDER — PHENYLEPHRINE HCL-NACL 10-0.9 MG/250ML-% IV SOLN
INTRAVENOUS | Status: DC | PRN
  Administered 2020-05-27: 40 ug/min via INTRAVENOUS

## 2020-05-27 MED ORDER — ONDANSETRON HCL 4 MG/2ML IJ SOLN: 4.0000 mg | Freq: Four times a day (QID) | INTRAMUSCULAR | Status: DC | PRN

## 2020-05-27 MED ORDER — PANTOPRAZOLE SODIUM 40 MG PO TBEC
40.0000 mg | DELAYED_RELEASE_TABLET | Freq: Every day | ORAL | Status: DC
  Administered 2020-05-27 – 2020-06-02 (×7): 40 mg via ORAL
  Filled 2020-05-27 (×7): qty 1

## 2020-05-27 MED ORDER — VANCOMYCIN HCL 1000 MG IV SOLR
INTRAVENOUS | Status: AC
  Filled 2020-05-27: qty 1000

## 2020-05-27 MED ORDER — OXYCODONE HCL 5 MG PO TABS: 5.0000 mg | ORAL_TABLET | Freq: Once | ORAL | Status: DC | PRN

## 2020-05-27 MED ORDER — DIPHENHYDRAMINE HCL 12.5 MG/5ML PO ELIX: 12.5000 mg | ORAL_SOLUTION | ORAL | Status: DC | PRN

## 2020-05-27 MED ORDER — PHENOL 1.4 % MT LIQD: 1.0000 | OROMUCOSAL | Status: DC | PRN

## 2020-05-27 MED ORDER — OXYCODONE HCL 5 MG PO TABS
10.0000 mg | ORAL_TABLET | ORAL | Status: DC | PRN
  Administered 2020-05-29 – 2020-06-01 (×4): 10 mg via ORAL
  Filled 2020-05-27 (×2): qty 2

## 2020-05-27 MED ORDER — PHENYLEPHRINE 40 MCG/ML (10ML) SYRINGE FOR IV PUSH (FOR BLOOD PRESSURE SUPPORT)
PREFILLED_SYRINGE | INTRAVENOUS | Status: DC | PRN
  Administered 2020-05-27 (×2): 80 ug via INTRAVENOUS

## 2020-05-27 MED ORDER — POVIDONE-IODINE 10 % EX SWAB
2.0000 "application " | Freq: Once | CUTANEOUS | Status: AC
  Administered 2020-05-27: 2 via TOPICAL

## 2020-05-27 MED ORDER — GLYCOPYRROLATE PF 0.2 MG/ML IJ SOSY
PREFILLED_SYRINGE | INTRAMUSCULAR | Status: AC
  Filled 2020-05-27: qty 2

## 2020-05-27 MED ORDER — ACETAMINOPHEN 325 MG PO TABS
325.0000 mg | ORAL_TABLET | Freq: Four times a day (QID) | ORAL | Status: DC | PRN
  Administered 2020-05-28: 650 mg via ORAL
  Filled 2020-05-27: qty 2

## 2020-05-27 MED ORDER — FUROSEMIDE 40 MG PO TABS
40.0000 mg | ORAL_TABLET | Freq: Every day | ORAL | Status: DC
  Administered 2020-05-27 – 2020-06-02 (×7): 40 mg via ORAL
  Filled 2020-05-27 (×7): qty 1

## 2020-05-27 MED ORDER — FENTANYL CITRATE (PF) 250 MCG/5ML IJ SOLN
INTRAMUSCULAR | Status: AC
  Filled 2020-05-27: qty 5

## 2020-05-27 MED ORDER — DULOXETINE HCL 60 MG PO CPEP
60.0000 mg | ORAL_CAPSULE | Freq: Every day | ORAL | Status: DC
  Administered 2020-05-28 – 2020-06-02 (×6): 60 mg via ORAL
  Filled 2020-05-27 (×6): qty 1

## 2020-05-27 MED ORDER — ORAL CARE MOUTH RINSE: 15.0000 mL | Freq: Once | OROMUCOSAL | Status: AC

## 2020-05-27 MED ORDER — HYDROMORPHONE HCL 1 MG/ML IJ SOLN
0.5000 mg | INTRAMUSCULAR | Status: DC | PRN
Start: 2020-05-27 — End: 2020-06-02
  Administered 2020-05-27 – 2020-05-31 (×4): 1 mg via INTRAVENOUS
  Administered 2020-05-31: 0.5 mg via INTRAVENOUS
  Administered 2020-06-02: 1 mg via INTRAVENOUS
  Filled 2020-05-27 (×6): qty 1

## 2020-05-27 MED ORDER — METHOCARBAMOL 1000 MG/10ML IJ SOLN
500.0000 mg | Freq: Four times a day (QID) | INTRAVENOUS | Status: DC | PRN
  Filled 2020-05-27: qty 5

## 2020-05-27 MED ORDER — OXYCODONE HCL 5 MG PO TABS
5.0000 mg | ORAL_TABLET | ORAL | Status: DC | PRN
  Administered 2020-05-27 – 2020-05-28 (×2): 10 mg via ORAL
  Administered 2020-05-28: 5 mg via ORAL
  Administered 2020-05-30: 10 mg via ORAL
  Filled 2020-05-27 (×8): qty 2

## 2020-05-27 MED ORDER — CHLORHEXIDINE GLUCONATE 0.12 % MT SOLN: 15.0000 mL | Freq: Once | OROMUCOSAL | Status: AC

## 2020-05-27 MED ORDER — ALUM & MAG HYDROXIDE-SIMETH 200-200-20 MG/5ML PO SUSP
30.0000 mL | ORAL | Status: DC | PRN
  Administered 2020-05-28: 12:00:00 30 mL via ORAL
  Filled 2020-05-27: qty 30

## 2020-05-27 MED ORDER — EPHEDRINE 5 MG/ML INJ
INTRAVENOUS | Status: AC
  Filled 2020-05-27: qty 10

## 2020-05-27 MED ORDER — CEFAZOLIN SODIUM-DEXTROSE 2-4 GM/100ML-% IV SOLN
INTRAVENOUS | Status: AC
  Filled 2020-05-27: qty 100

## 2020-05-27 MED ORDER — MIDAZOLAM HCL 5 MG/5ML IJ SOLN
INTRAMUSCULAR | Status: DC | PRN
  Administered 2020-05-27: 2 mg via INTRAVENOUS

## 2020-05-27 MED ORDER — OXYCODONE HCL 5 MG/5ML PO SOLN: 5.0000 mg | Freq: Once | ORAL | Status: DC | PRN

## 2020-05-27 MED ORDER — GLYCOPYRROLATE PF 0.2 MG/ML IJ SOSY
PREFILLED_SYRINGE | INTRAMUSCULAR | Status: AC
  Filled 2020-05-27: qty 1

## 2020-05-27 MED ORDER — METOCLOPRAMIDE HCL 5 MG PO TABS: 5.0000 mg | ORAL_TABLET | Freq: Three times a day (TID) | ORAL | Status: DC | PRN

## 2020-05-27 MED ORDER — CHLORHEXIDINE GLUCONATE 0.12 % MT SOLN
OROMUCOSAL | Status: AC
  Administered 2020-05-27: 13:00:00 15 mL via OROMUCOSAL
  Filled 2020-05-27: qty 15

## 2020-05-27 MED ORDER — ALBUMIN HUMAN 5 % IV SOLN: INTRAVENOUS | Status: DC | PRN

## 2020-05-27 MED ORDER — FENTANYL CITRATE (PF) 100 MCG/2ML IJ SOLN
INTRAMUSCULAR | Status: DC | PRN
  Administered 2020-05-27: 50 ug via INTRAVENOUS

## 2020-05-27 MED ORDER — METOCLOPRAMIDE HCL 5 MG/ML IJ SOLN: 5.0000 mg | Freq: Three times a day (TID) | INTRAMUSCULAR | Status: DC | PRN

## 2020-05-27 MED ORDER — ONDANSETRON HCL 4 MG/2ML IJ SOLN: 4.0000 mg | Freq: Once | INTRAMUSCULAR | Status: DC | PRN

## 2020-05-27 MED ORDER — ONDANSETRON HCL 4 MG PO TABS: 4.0000 mg | ORAL_TABLET | Freq: Four times a day (QID) | ORAL | Status: DC | PRN

## 2020-05-27 MED ORDER — PROPOFOL 500 MG/50ML IV EMUL
INTRAVENOUS | Status: DC | PRN
  Administered 2020-05-27: 100 ug/kg/min via INTRAVENOUS

## 2020-05-27 MED ORDER — POLYETHYLENE GLYCOL 3350 17 G PO PACK
17.0000 g | PACK | Freq: Every day | ORAL | Status: DC | PRN
  Administered 2020-05-29: 13:00:00 17 g via ORAL
  Filled 2020-05-27: qty 1

## 2020-05-27 MED ORDER — ASPIRIN 81 MG PO CHEW
81.0000 mg | CHEWABLE_TABLET | Freq: Two times a day (BID) | ORAL | Status: DC
  Administered 2020-05-27 – 2020-06-02 (×12): 81 mg via ORAL
  Filled 2020-05-27 (×12): qty 1

## 2020-05-27 MED ORDER — PROPOFOL 10 MG/ML IV BOLUS
INTRAVENOUS | Status: AC
  Filled 2020-05-27: qty 20

## 2020-05-27 MED ORDER — SODIUM CHLORIDE 0.9 % IV SOLN: INTRAVENOUS | Status: DC

## 2020-05-27 MED ORDER — DOCUSATE SODIUM 100 MG PO CAPS
100.0000 mg | ORAL_CAPSULE | Freq: Two times a day (BID) | ORAL | Status: DC
  Administered 2020-05-27 – 2020-06-02 (×11): 100 mg via ORAL
  Filled 2020-05-27 (×12): qty 1

## 2020-05-27 MED ORDER — IRRISEPT - 450ML BOTTLE WITH 0.05% CHG IN STERILE WATER, USP 99.95% OPTIME
TOPICAL | Status: DC | PRN
  Administered 2020-05-27: 16:00:00 450 mL

## 2020-05-27 MED ORDER — IRBESARTAN 150 MG PO TABS
150.0000 mg | ORAL_TABLET | Freq: Every day | ORAL | Status: DC
  Administered 2020-05-27 – 2020-06-02 (×7): 150 mg via ORAL
  Filled 2020-05-27 (×7): qty 1

## 2020-05-27 MED ORDER — PROPOFOL 10 MG/ML IV BOLUS
INTRAVENOUS | Status: DC | PRN
  Administered 2020-05-27: 20 mg via INTRAVENOUS

## 2020-05-27 MED ORDER — MENTHOL 3 MG MT LOZG: 1.0000 | LOZENGE | OROMUCOSAL | Status: DC | PRN

## 2020-05-27 MED ORDER — LACTATED RINGERS IV SOLN: INTRAVENOUS | Status: DC

## 2020-05-27 MED ORDER — METHOCARBAMOL 500 MG PO TABS
500.0000 mg | ORAL_TABLET | Freq: Four times a day (QID) | ORAL | Status: DC | PRN
  Administered 2020-05-30 – 2020-06-01 (×2): 500 mg via ORAL
  Filled 2020-05-27 (×2): qty 1

## 2020-05-27 SURGICAL SUPPLY — 65 items
BALL HIP ARTICU EZE 36 8.5 (Hips) ×1 IMPLANT
BENZOIN TINCTURE PRP APPL 2/3 (GAUZE/BANDAGES/DRESSINGS) IMPLANT
BLADE CLIPPER SURG (BLADE) IMPLANT
BLADE SAW SGTL 18X1.27X75 (BLADE) IMPLANT
BLADE SAW SGTL 18X1.27X75MM (BLADE)
CANISTER WOUNDNEG PRESSURE 500 (CANNISTER) ×3 IMPLANT
CLOSURE WOUND 1/2 X4 (GAUZE/BANDAGES/DRESSINGS)
COVER SURGICAL LIGHT HANDLE (MISCELLANEOUS) ×3 IMPLANT
COVER WAND RF STERILE (DRAPES) IMPLANT
CUP ACET PNNCL SECTR W/GRIP 56 (Hips) ×1 IMPLANT
CUP SECTOR GRIPTON 58MM (Orthopedic Implant) ×3 IMPLANT
DRAPE C-ARM 42X72 X-RAY (DRAPES) ×3 IMPLANT
DRAPE STERI IOBAN 125X83 (DRAPES) ×3 IMPLANT
DRAPE U-SHAPE 47X51 STRL (DRAPES) ×9 IMPLANT
DRSG AQUACEL AG ADV 3.5X10 (GAUZE/BANDAGES/DRESSINGS) IMPLANT
DRSG VAC ATS SM SENSATRAC (GAUZE/BANDAGES/DRESSINGS) ×3 IMPLANT
DURAPREP 26ML APPLICATOR (WOUND CARE) ×3 IMPLANT
ELECT BLADE 4.0 EZ CLEAN MEGAD (MISCELLANEOUS) ×3
ELECT BLADE 6.5 EXT (BLADE) IMPLANT
ELECT REM PT RETURN 9FT ADLT (ELECTROSURGICAL) ×3
ELECTRODE BLDE 4.0 EZ CLN MEGD (MISCELLANEOUS) ×1 IMPLANT
ELECTRODE REM PT RTRN 9FT ADLT (ELECTROSURGICAL) ×1 IMPLANT
FACESHIELD WRAPAROUND (MASK) ×6 IMPLANT
GLOVE BIOGEL PI IND STRL 8 (GLOVE) ×2 IMPLANT
GLOVE BIOGEL PI INDICATOR 8 (GLOVE) ×4
GLOVE ECLIPSE 8.0 STRL XLNG CF (GLOVE) ×3 IMPLANT
GLOVE ORTHO TXT STRL SZ7.5 (GLOVE) ×6 IMPLANT
GOWN STRL REUS W/ TWL LRG LVL3 (GOWN DISPOSABLE) ×2 IMPLANT
GOWN STRL REUS W/ TWL XL LVL3 (GOWN DISPOSABLE) ×2 IMPLANT
GOWN STRL REUS W/TWL LRG LVL3 (GOWN DISPOSABLE) ×4
GOWN STRL REUS W/TWL XL LVL3 (GOWN DISPOSABLE) ×4
HANDPIECE INTERPULSE COAX TIP (DISPOSABLE) ×2
HEAD M SROM 36MM PLUS 1.5 (Hips) ×1 IMPLANT
HIP BALL ARTICU EZE 36 8.5 (Hips) ×3 IMPLANT
KIT BASIN OR (CUSTOM PROCEDURE TRAY) ×3 IMPLANT
KIT PREVENA INCISION MGT 13 (CANNISTER) IMPLANT
KIT TURNOVER KIT B (KITS) ×3 IMPLANT
LINER NEUTRAL 36X58 PLUS4 ×3 IMPLANT
MANIFOLD NEPTUNE II (INSTRUMENTS) ×3 IMPLANT
NS IRRIG 1000ML POUR BTL (IV SOLUTION) ×3 IMPLANT
PACK TOTAL JOINT (CUSTOM PROCEDURE TRAY) ×3 IMPLANT
PAD ARMBOARD 7.5X6 YLW CONV (MISCELLANEOUS) ×3 IMPLANT
PINN SECTOR W/GRIP ACE CUP 56 (Hips) ×3 IMPLANT
SCREW 6.5MMX25MM (Screw) ×6 IMPLANT
SCREW 6.5MMX35MM (Screw) ×3 IMPLANT
SCREW PINN CAN BONE 6.5MMX15MM (Screw) ×3 IMPLANT
SET HNDPC FAN SPRY TIP SCT (DISPOSABLE) ×1 IMPLANT
SROM M HEAD 36MM PLUS 1.5 (Hips) ×3 IMPLANT
STAPLER VISISTAT 35W (STAPLE) IMPLANT
STRIP CLOSURE SKIN 1/2X4 (GAUZE/BANDAGES/DRESSINGS) IMPLANT
SUT ETHIBOND NAB CT1 #1 30IN (SUTURE) ×3 IMPLANT
SUT ETHILON 2 0 PSLX (SUTURE) ×9 IMPLANT
SUT MNCRL AB 4-0 PS2 18 (SUTURE) IMPLANT
SUT VIC AB 0 CT1 27 (SUTURE) ×2
SUT VIC AB 0 CT1 27XBRD ANBCTR (SUTURE) ×1 IMPLANT
SUT VIC AB 1 CT1 27 (SUTURE) ×2
SUT VIC AB 1 CT1 27XBRD ANBCTR (SUTURE) ×1 IMPLANT
SUT VIC AB 2-0 CT1 27 (SUTURE) ×6
SUT VIC AB 2-0 CT1 TAPERPNT 27 (SUTURE) ×3 IMPLANT
TOWEL GREEN STERILE (TOWEL DISPOSABLE) ×3 IMPLANT
TOWEL GREEN STERILE FF (TOWEL DISPOSABLE) ×3 IMPLANT
TRAY CATH 16FR W/PLASTIC CATH (SET/KITS/TRAYS/PACK) IMPLANT
TRAY FOLEY W/BAG SLVR 16FR (SET/KITS/TRAYS/PACK) ×2
TRAY FOLEY W/BAG SLVR 16FR ST (SET/KITS/TRAYS/PACK) ×1 IMPLANT
WATER STERILE IRR 1000ML POUR (IV SOLUTION) ×6 IMPLANT

## 2020-05-27 NOTE — Interval H&P Note (Signed)
History and Physical Interval Note: The patient understands that she is here today to have a revision arthroplasty of her right total hip acetabular component that has become loose.  This is aseptic loosening and there is been no evidence of infection.  There has been no other acute change in her medical status.  See recent H&P.  The risk and benefits of this surgery have been described in detail and informed consent is obtained.  The right hip area has been marked.  05/27/2020 2:26 PM  Monica Serrano  has presented today for surgery, with the diagnosis of failed acetabular component right hip.  The various methods of treatment have been discussed with the patient and family. After consideration of risks, benefits and other options for treatment, the patient has consented to  Procedure(s): REVISION ACETABULAR COMPONENT RIGHT HIP (Right) as a surgical intervention.  The patient's history has been reviewed, patient examined, no change in status, stable for surgery.  I have reviewed the patient's chart and labs.  Questions were answered to the patient's satisfaction.     Kathryne Hitch

## 2020-05-27 NOTE — Transfer of Care (Signed)
Immediate Anesthesia Transfer of Care Note  Patient: Monica Serrano  Procedure(s) Performed: REVISION ACETABULAR COMPONENT RIGHT HIP PLACEMENT OF WOUND VAC (Right Hip)  Patient Location: PACU  Anesthesia Type:MAC and Spinal  Level of Consciousness: awake, alert , oriented and patient cooperative  Airway & Oxygen Therapy: Patient Spontanous Breathing and Patient connected to face mask oxygen  Post-op Assessment: Report given to RN and Post -op Vital signs reviewed and stable  Post vital signs: Reviewed and stable  Last Vitals:  Vitals Value Taken Time  BP 93/48 05/27/20 1751  Temp    Pulse 82 05/27/20 1754  Resp 14 05/27/20 1754  SpO2 100 % 05/27/20 1754  Vitals shown include unvalidated device data.  Last Pain:  Vitals:   05/27/20 1312  TempSrc: Oral  PainSc: 0-No pain         Complications: No complications documented.

## 2020-05-27 NOTE — Anesthesia Procedure Notes (Signed)
Spinal  Patient location during procedure: OB Start time: 05/27/2020 3:04 PM End time: 05/27/2020 3:10 PM Staffing Performed: anesthesiologist  Anesthesiologist: Trevor Iha, MD Preanesthetic Checklist Completed: patient identified, IV checked, risks and benefits discussed, surgical consent, monitors and equipment checked, pre-op evaluation and timeout performed Spinal Block Patient position: sitting Prep: DuraPrep and site prepped and draped Patient monitoring: heart rate, cardiac monitor, continuous pulse ox and blood pressure Approach: midline Location: L3-4 Injection technique: single-shot Needle Needle type: Pencan  Needle gauge: 24 G Needle length: 10 cm Assessment Sensory level: T4 Additional Notes  Attempt (s). Pt tolerated procedure well.

## 2020-05-27 NOTE — Brief Op Note (Signed)
05/27/2020  5:34 PM  PATIENT:  Monica Serrano  69 y.o. female  PRE-OPERATIVE DIAGNOSIS:  failed acetabular component right hip  POST-OPERATIVE DIAGNOSIS:  failed acetabular component right hip  PROCEDURE:  Procedure(s): REVISION ACETABULAR COMPONENT RIGHT HIP PLACEMENT OF WOUND VAC (Right)  SURGEON:  Surgeon(s) and Role:    Kathryne Hitch, MD - Primary  PHYSICIAN ASSISTANT:  Rexene Edison, PA-C  ANESTHESIA:   spinal  EBL:  300 mL   COUNTS:  YES  DICTATION: .Other Dictation: Dictation Number [id number]  PLAN OF CARE: Inpatient Admission  PATIENT DISPOSITION:  PACU - hemodynamically stable.   Delay start of Pharmacological VTE agent (>24hrs) due to surgical blood loss or risk of bleeding: no

## 2020-05-27 NOTE — Anesthesia Procedure Notes (Signed)
Procedure Name: MAC Date/Time: 05/27/2020 3:12 PM Performed by: Candis Shine, CRNA Pre-anesthesia Checklist: Patient identified, Emergency Drugs available, Suction available and Patient being monitored Patient Re-evaluated:Patient Re-evaluated prior to induction Oxygen Delivery Method: Nasal cannula Dental Injury: Teeth and Oropharynx as per pre-operative assessment

## 2020-05-28 LAB — BASIC METABOLIC PANEL WITH GFR
Anion gap: 10 (ref 5–15)
BUN: 12 mg/dL (ref 8–23)
CO2: 24 mmol/L (ref 22–32)
Calcium: 8.9 mg/dL (ref 8.9–10.3)
Chloride: 101 mmol/L (ref 98–111)
Creatinine, Ser: 1.32 mg/dL — ABNORMAL HIGH (ref 0.44–1.00)
GFR, Estimated: 44 mL/min — ABNORMAL LOW
Glucose, Bld: 107 mg/dL — ABNORMAL HIGH (ref 70–99)
Potassium: 4.1 mmol/L (ref 3.5–5.1)
Sodium: 135 mmol/L (ref 135–145)

## 2020-05-28 LAB — CBC
HCT: 30.7 % — ABNORMAL LOW (ref 36.0–46.0)
Hemoglobin: 9.7 g/dL — ABNORMAL LOW (ref 12.0–15.0)
MCH: 28.4 pg (ref 26.0–34.0)
MCHC: 31.6 g/dL (ref 30.0–36.0)
MCV: 89.8 fL (ref 80.0–100.0)
Platelets: 454 K/uL — ABNORMAL HIGH (ref 150–400)
RBC: 3.42 MIL/uL — ABNORMAL LOW (ref 3.87–5.11)
RDW: 15 % (ref 11.5–15.5)
WBC: 13.2 K/uL — ABNORMAL HIGH (ref 4.0–10.5)
nRBC: 0 % (ref 0.0–0.2)

## 2020-05-28 MED ORDER — ASCORBIC ACID 500 MG PO TABS
500.0000 mg | ORAL_TABLET | Freq: Every day | ORAL | Status: DC
  Administered 2020-05-28 – 2020-06-02 (×6): 500 mg via ORAL
  Filled 2020-05-28 (×6): qty 1

## 2020-05-28 MED ORDER — FLUCONAZOLE 150 MG PO TABS
150.0000 mg | ORAL_TABLET | Freq: Once | ORAL | Status: AC
  Administered 2020-05-28: 10:00:00 150 mg via ORAL
  Filled 2020-05-28: qty 1

## 2020-05-28 MED ORDER — ZINC SULFATE 220 (50 ZN) MG PO CAPS
220.0000 mg | ORAL_CAPSULE | Freq: Every day | ORAL | Status: DC
  Administered 2020-05-28 – 2020-06-02 (×6): 220 mg via ORAL
  Filled 2020-05-28 (×6): qty 1

## 2020-05-28 NOTE — Evaluation (Signed)
Physical Therapy Evaluation Patient Details Name: Monica Serrano MRN: [id number] DOB: 22-Mar-1951 Today's Date: 05/28/2020   History of Present Illness  Patient is 69 y/o female with PMH of CKD, DVT, lymphedema, HTN, OA, sleep apnea, and vertigo. Patient underwent R THA on 10/15 due to longstanding and worsening pain in right hip starting 3 years ago. Patient s/p R THA revision on 12/21.  Clinical Impression  Patient presents with deficits in strength, endurance, balance, and functional mobility. Session limited by pain. Patient able to perform sit to stand and stand pivot transfer with RW and min guard. Patient hopeful for rehab to get stronger and return to PLOF. Patient will benefit from skilled PT services during acute stay to address listed deficits. Recommend SNF following discharge to maximize functional mobility and independence.     Follow Up Recommendations SNF;Supervision/Assistance - 24 hour    Equipment Recommendations   (defer to post acute rehab)    Recommendations for Other Services       Precautions / Restrictions Precautions Precautions: Fall Restrictions Weight Bearing Restrictions: Yes RLE Weight Bearing: Weight bearing as tolerated      Mobility  Bed Mobility Overal bed mobility: Needs Assistance Bed Mobility: Supine to Sit;Sit to Supine     Supine to sit: Min guard;HOB elevated Sit to supine: Min assist   General bed mobility comments: min guard for supine>sit, no physical assist required. minA for sit>supine for advancement of R LE onto bed    Transfers Overall transfer level: Needs assistance Equipment used: Rolling Darianne Muralles (2 wheeled) Transfers: Sit to/from [employer] Sit to Stand: Min guard Stand pivot transfers: Min guard       General transfer comment: stand pivot t/f bed<>BSC with min guard and RW  Ambulation/Gait             General Gait Details: declined further ambulation due to pain and drowsiness  Stairs             Wheelchair Mobility    Modified Rankin (Stroke Patients Only)       Balance Overall balance assessment: Mild deficits observed, not formally tested                                           Pertinent Vitals/Pain Pain Assessment: Faces Faces Pain Scale: Hurts little more Pain Location: R hip Pain Descriptors / Indicators: Guarding;Grimacing Pain Intervention(s): Monitored during session    Home Living Family/patient expects to be discharged to:: Skilled nursing facility Living Arrangements: Alone                    Prior Function Level of Independence: Independent with assistive device(s)               Hand Dominance        Extremity/Trunk Assessment   Upper Extremity Assessment Upper Extremity Assessment: Generalized weakness    Lower Extremity Assessment Lower Extremity Assessment: Generalized weakness       Communication   Communication: HOH  Cognition Arousal/Alertness: Awake/alert Behavior During Therapy: WFL for tasks assessed/performed Overall Cognitive Status: Within Functional Limits for tasks assessed                                        General Comments      Exercises  Assessment/Plan    PT Assessment Patient needs continued PT services  PT Problem List Decreased strength;Decreased range of motion;Decreased activity tolerance;Decreased balance;Decreased mobility       PT Treatment Interventions DME instruction;Gait training;Stair training;Functional mobility training;Therapeutic activities;Therapeutic exercise;Balance training;Patient/family education    PT Goals (Current goals can be found in the Care Plan section)  Acute Rehab PT Goals Patient Stated Goal: to get better PT Goal Formulation: With patient Time For Goal Achievement: 06/11/20 Potential to Achieve Goals: Good    Frequency Min 3X/week   Barriers to discharge        Co-evaluation                AM-PAC PT "6 Clicks" Mobility  Outcome Measure Help needed turning from your back to your side while in a flat bed without using bedrails?: A Little Help needed moving from lying on your back to sitting on the side of a flat bed without using bedrails?: A Little Help needed moving to and from a bed to a chair (including a wheelchair)?: A Little Help needed standing up from a chair using your arms (e.g., wheelchair or bedside chair)?: A Little Help needed to walk in hospital room?: A Little Help needed climbing 3-5 steps with a railing? : A Lot 6 Click Score: 17    End of Session Equipment Utilized During Treatment: Gait belt Activity Tolerance: Patient limited by pain Patient left: in bed;with call bell/phone within reach Nurse Communication: Mobility status PT Visit Diagnosis: Unsteadiness on feet (R26.81);Other abnormalities of gait and mobility (R26.89);Muscle weakness (generalized) (M62.81);Difficulty in walking, not elsewhere classified (R26.2)    Time: 1610-9604 PT Time Calculation (min) (ACUTE ONLY): 24 min   Charges:   PT Evaluation $PT Eval Low Complexity: 1 Low PT Treatments $Therapeutic Activity: 8-22 mins        Gregor Hams, PT, DPT Acute Rehabilitation Services Pager [phone] Office [phone]   Zannie Kehr Allred 05/28/2020, 3:04 PM

## 2020-05-28 NOTE — Op Note (Signed)
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  PROCEDURE:  Revision arthroplasty, right hip acetabular component.  IMPLANTS:  [employer] acetabular component size 58 with 3 screws, size 36+4 neutral polyethylene liner, size 36+8.5 metal hip ball.  SURGEON:  Vanita Panda. Magnus Ivan, MD  ASSISTANT:  Richardean Canal, PA-C  ANESTHESIA:  Spinal.  ANTIBIOTICS:  Two grams IV Ancef.  BLOOD LOSS:  300 mL.  COMPLICATIONS:  None.  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  Her x-rays looked horrible in terms of the bone-on-bone wear of her hip.  There was  collapse of the femoral head.  She had a lot of problems walking and was a significant fall risk.  In light of her morbid obesity, she had lost a significant amount of weight, with she says now about 50 pounds total.  When she had seen me, she lost about  35 pounds and she was down to about a 40 BMI.  She certainly has a very large thigh and I did agree to proceed with hip replacement surgery knowing that I think she was going to still continue well on her weight loss journey.  I did explain at the time,  there is significant risk in the obese population with soft tissue issues and implant failure.  At the time of her original surgery, when I placed the acetabular component, I felt good about it based on what I can remember about the case in my operative  note and I still placed 2 screws to secure the acetabular component.  Postoperatively, we then saw her in the office and  she was having some pain with weightbearing, but we were concerned more at the time about soft tissue breakdown of her incision.   She definitely had some dehiscence and a large seroma.  I saw her on several occasions, trying to control the wound in the office and aspirating the seroma on multiple occasions.  She ended up going to an outpatient wound center and had done a very good  job and they had done a very good job of getting the wound close to close.  However, her weightbearing got worse and I obtained x-rays in the office and I felt the acetabular component had obviously loosened and had changed position.  Her hip was located  and the femoral component looked good.  Based on these findings and her pain with weightbearing, I recommended revision of the acetabular component.  I had a long and thorough discussion about the risk of this surgery including mainly the risk of still  having skin and soft tissue issues and infection, having to go back in the hip again, but I do not see that we have much of a choice based on loosening of the acetabular component.  We had a long and thorough discussion with her and her daughter and she  did wish to proceed with surgery, given her pain and problems with weightbearing.  DESCRIPTION OF PROCEDURE:  After informed consent was obtained and appropriate right hip was marked, she was brought to the operating room and sat up on the stretcher where spinal anesthesia was obtained.  She was then laid in the supine position on the  stretcher.  Foley catheter was placed and traction boots were placed on both her feet.  I placed her supine on the Hana fracture table with the perineal post in place and both legs in line skeletal traction devices and no traction applied.  We then had  to tape her abdomen and pannus out of the field and was able to assess her wound.  We had removed her previous wound VAC and there was only a small opening and no significant drainage at all.  No  evidence of infection.  This did tunnel down to the tensor  fascia, but looked great overall.  The skin also looked good.  We then prepped her hip with DuraPrep and sterile drapes.  A timeout was called and she was identified as correct patient, correct right hip.  I then went through her previous incision and  ellipsed out the previous hole.  We dissected down tensor fascia lata and tensor fascia was then divided longitudinally, so we go back to our plane of the previous surgery that was done 2 months ago.  We were able to open up the hip joint.  We did find  fluid in the hip joint, but nothing was consistent with infection.  We then dislocated the hip and removed the original hip ball.  I then removed the acetabular alignment and the 2 previous screws.  I placed the inserter device on the acetabular  component and removed it easily.  Again, there was no evidence of infection.  I then used a curette to curette out  fibrinous tissue that had developed in the acetabulum.  We then irrigated the hip with 3 liters normal saline solution using pulsatile  lavage.  I then began reaming under direct visualization and fluoroscopy to ream up to a size 58 cup.  Originally, she had a 54 cup and we actually went with a 56 and looking at it under direct visualization, looks like it was going to have a good fit.   However, we could not get this 56 cup to seat.  I felt then that we need not ream any further, but we needed to put a size 58 cup.  I still felt better about a 58 cup with 3 holes as opposed to a multi-hole cup because she had good bleeding all around  the acetabulum and I felt that this would give [place] the best chance at getting some bone ingrowth.  Once I did place a size 58 acetabular component, I did not get it to move and I still secured it with 3 screws, with 1 screw penetrating the inner table but  just barely.  I then placed a 36+4 polyethylene liner.  We went with a 36+1.5 hip ball, but obviously with  medializing her, she was not stable, so we went up to a +8.5 metal hip ball and reduced this in the acetabulum and it was stable.  We then  irrigated the hip again with normal saline solution.  We then irrigated with an antibiotic solution.  Once we had dried it real well, we placed vancomycin powder within our arthrotomy and close our arthrotomy with interrupted #1 Ethibond suture.  #1  Vicryl was used to close the tensor fascia, 0 Vicryl to close the deep tissue, 2-0 Vicryl was used to close subcutaneous tissue and the skin was reapproximated with nylon suture.  We did place an incisional VAC, hoping to protect the incision and  set  this to suction and had a good seal.  She was then taken off the Hana table and taken to recovery room in stable condition with all final counts being correct.  No complications noted.  Of note, Rexene Edison, PA-C, assisted during the entire case and  assistance was crucial for facilitating all aspects of this case.  HN/NUANCE  D:05/27/2020 T:05/28/2020 JOB:[id number]

## 2020-05-28 NOTE — Progress Notes (Signed)
Subjective: 1 Day Post-Op Procedure(s) (LRB): REVISION ACETABULAR COMPONENT RIGHT HIP PLACEMENT OF WOUND VAC (Right) Patient reports pain as moderate.  Acute blood loss anemia from extensive surgery.  Objective: Vital signs in last 24 hours: Temp:  [97.9 F (36.6 C)-98.8 F (37.1 C)] 98 F (36.7 C) (12/22 0300) Pulse Rate:  [75-98] 98 (12/22 0300) Resp:  [10-19] 17 (12/22 0300) BP: (93-131)/(47-112) 106/53 (12/22 0300) SpO2:  [97 %-100 %] 98 % (12/22 0300) Weight:  [99.8 kg] 99.8 kg (12/21 1312)  Intake/Output from previous day: 12/21 0701 - 12/22 0700 In: 2390 [P.O.:240; I.V.:1650; IV Piggyback:250] Out: 1300 [Urine:1000; Blood:300] Intake/Output this shift: No intake/output data recorded.  Recent Labs    05/26/20 1455 05/28/20 0250  HGB 11.3* 9.7*   Recent Labs    05/26/20 1455 05/28/20 0250  WBC 12.6* 13.2*  RBC 4.00 3.42*  HCT 35.9* 30.7*  PLT 603* 454*   Recent Labs    05/26/20 1455 05/28/20 0250  NA 136 135  K 4.2 4.1  CL 100 101  CO2 22 24  BUN 15 12  CREATININE 1.39* 1.32*  GLUCOSE 98 107*  CALCIUM 10.0 8.9   No results for input(s): LABPT, INR in the last 72 hours.  Sensation intact distally Intact pulses distally Dorsiflexion/Plantar flexion intact VAC over closed incision right hip  Assessment/Plan: 1 Day Post-Op Procedure(s) (LRB): REVISION ACETABULAR COMPONENT RIGHT HIP PLACEMENT OF WOUND VAC (Right) Up with therapy Only work on her mobility with WBAT, balance and coordination. Will need short-term SNF placement - will consult Trans. Care Team     Kathryne Hitch 05/28/2020, 7:28 AM

## 2020-05-28 NOTE — Anesthesia Postprocedure Evaluation (Signed)
Anesthesia Post Note  Patient: Monica Serrano  Procedure(s) Performed: REVISION ACETABULAR COMPONENT RIGHT HIP PLACEMENT OF WOUND VAC (Right Hip)     Patient location during evaluation: PACU Anesthesia Type: Spinal Level of consciousness: awake and alert Pain management: pain level controlled Vital Signs Assessment: post-procedure vital signs reviewed and stable Respiratory status: spontaneous breathing and respiratory function stable Cardiovascular status: blood pressure returned to baseline and stable Postop Assessment: spinal receding and no apparent nausea or vomiting Anesthetic complications: no   No complications documented.  Last Vitals:  Vitals:   05/27/20 2350 05/28/20 0300  BP: 126/60 (!) 106/53  Pulse: 96 98  Resp: 16 17  Temp: 36.7 C 36.7 C  SpO2: 97% 98%    Last Pain:  Vitals:   05/28/20 0809  TempSrc:   PainSc: 5                  Beryle Lathe

## 2020-05-28 NOTE — NC FL2 (Signed)
[place] MEDICAID FL2 LEVEL OF CARE SCREENING TOOL     IDENTIFICATION  Patient Name: Monica Serrano Birthdate: 01/23/1951 Sex: female Admission Date (Current Location): 05/27/2020  [facility] and [place] Number:  Producer, television/film/video and Address:  [facility] [place], [address], [place], [place] [address]      Provider Number: [id number]  Attending Physician Name and Address:  Kathryne Hitch  Relative Name and Phone Number:       Current Level of Care: Hospital Recommended Level of Care: Skilled Nursing Facility Prior Approval Number:    Date Approved/Denied: 09/08/17 PASRR Number: [id number]  Discharge Plan: SNF    Current Diagnoses: Patient Active Problem List   Diagnosis Date Noted  . Status post revision of total hip 05/27/2020  . 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 Status post total replacement of right hip 03/21/2020  . Unilateral primary osteoarthritis, right hip 01/09/2020    Orientation RESPIRATION BLADDER Height & Weight     Self,Time,Situation,Place  Normal Continent Weight: 99.8 kg Height:  5\' 5"  (165.1 cm)  BEHAVIORAL SYMPTOMS/MOOD NEUROLOGICAL BOWEL NUTRITION STATUS      Continent Diet  AMBULATORY STATUS COMMUNICATION OF NEEDS Skin   Extensive Assist Verbally Surgical wounds,Wound Vac (Right Hip Surgical Incision)                       Personal Care Assistance Level of Assistance  Bathing,Dressing Bathing Assistance: Limited assistance Feeding assistance: Independent Dressing Assistance: Limited assistance     Functional Limitations Info  Sight,Hearing Sight Info: Adequate Hearing Info: Adequate      SPECIAL CARE FACTORS FREQUENCY  PT (By licensed PT),OT (By licensed OT)     PT Frequency: 5 times a week OT Frequency: 5 times a week            Contractures Contractures Info: Not present    Additional Factors Info  Code Status,Allergies Code Status Info: Full Code Allergies  Info: No Known Allergies           Current Medications (05/28/2020):  This is the current hospital active medication list Current Facility-Administered Medications  Medication Dose Route Frequency Provider Last Rate Last Admin  . 0.9 %  sodium chloride infusion   Intravenous Continuous 05/30/2020, MD 75 mL/hr at 05/27/20 2105 New Bag at 05/27/20 2105  . acetaminophen (TYLENOL) tablet 325-650 mg  325-650 mg Oral Q6H PRN 2106, MD      . alum & mag hydroxide-simeth (MAALOX/MYLANTA) 200-200-20 MG/5ML suspension 30 mL  30 mL Oral Q4H PRN 12-14-2000, MD      . ascorbic acid (VITAMIN C) tablet 500 mg  500 mg Oral Daily Kathryne Hitch, MD   500 mg at 05/28/20 0950  . aspirin chewable tablet 81 mg  81 mg Oral BID 05/30/20, MD   81 mg at 05/28/20 0950  . atorvastatin (LIPITOR) tablet 20 mg  20 mg Oral Daily 05/30/20, MD   20 mg at 05/28/20 0949  . bisacodyl (DULCOLAX) suppository 10 mg  10 mg Rectal Daily PRN 05/30/20, MD      . ceFAZolin (ANCEF) IVPB 1 g/50 mL premix  1 g Intravenous Q8H Kathryne Hitch, MD 100 mL/hr at 05/28/20 05/30/20 1 g at 05/28/20 05/30/20  . cholecalciferol (VITAMIN D3) tablet 2,000 Units  2,000 Units Oral Daily 7846, MD   2,000 Units at 05/28/20 [phone]  .  diphenhydrAMINE (BENADRYL) 12.5 MG/5ML elixir 12.5-25 mg  12.5-25 mg Oral Q4H PRN Kathryne Hitch, MD      . docusate sodium (COLACE) capsule 100 mg  100 mg Oral BID Kathryne Hitch, MD   100 mg at 05/28/20 0948  . DULoxetine (CYMBALTA) DR capsule 60 mg  60 mg Oral Daily Kathryne Hitch, MD   60 mg at 05/28/20 0951  . furosemide (LASIX) tablet 40 mg  40 mg Oral Daily Kathryne Hitch, MD   40 mg at 05/28/20 0947  . gabapentin (NEURONTIN) capsule 300 mg  300 mg Oral BID Kathryne Hitch, MD   300 mg at 05/28/20 0950  . HYDROmorphone (DILAUDID) injection 0.5-1 mg  0.5-1 mg  Intravenous Q4H PRN Kathryne Hitch, MD   1 mg at 05/28/20 0357  . irbesartan (AVAPRO) tablet 150 mg  150 mg Oral Daily Kathryne Hitch, MD   150 mg at 05/28/20 0949  . menthol-cetylpyridinium (CEPACOL) lozenge 3 mg  1 lozenge Oral PRN Kathryne Hitch, MD       Or  . phenol (CHLORASEPTIC) mouth spray 1 spray  1 spray Mouth/Throat PRN Kathryne Hitch, MD      . methocarbamol (ROBAXIN) tablet 500 mg  500 mg Oral Q6H PRN Kathryne Hitch, MD       Or  . methocarbamol (ROBAXIN) 500 mg in dextrose 5 % 50 mL IVPB  500 mg Intravenous Q6H PRN Kathryne Hitch, MD      . metoCLOPramide (REGLAN) tablet 5-10 mg  5-10 mg Oral Q8H PRN Kathryne Hitch, MD       Or  . metoCLOPramide (REGLAN) injection 5-10 mg  5-10 mg Intravenous Q8H PRN Kathryne Hitch, MD      . ondansetron [facility]) tablet 4 mg  4 mg Oral Q6H PRN Kathryne Hitch, MD       Or  . ondansetron [facility]) injection 4 mg  4 mg Intravenous Q6H PRN Kathryne Hitch, MD      . oxyCODONE (Oxy IR/ROXICODONE) immediate release tablet 10-15 mg  10-15 mg Oral Q4H PRN Kathryne Hitch, MD      . oxyCODONE (Oxy IR/ROXICODONE) immediate release tablet 5-10 mg  5-10 mg Oral Q4H PRN Kathryne Hitch, MD   10 mg at 05/28/20 6629  . pantoprazole (PROTONIX) EC tablet 40 mg  40 mg Oral Daily Kathryne Hitch, MD   40 mg at 05/28/20 0947  . polyethylene glycol (MIRALAX / GLYCOLAX) packet 17 g  17 g Oral Daily PRN Kathryne Hitch, MD      . rOPINIRole (REQUIP) tablet 1 mg  1 mg Oral QHS Kathryne Hitch, MD   1 mg at 05/27/20 2226  . spironolactone (ALDACTONE) tablet 25 mg  25 mg Oral Daily Kathryne Hitch, MD   25 mg at 05/28/20 0951  . zinc sulfate capsule 220 mg  220 mg Oral Daily Kathryne Hitch, MD   220 mg at 05/28/20 4765     Discharge Medications: Please see discharge summary for a list of discharge medications.  Relevant  Imaging Results:  Relevant Lab Results:   Additional Information [id number]  [name], RN

## 2020-05-28 NOTE — TOC Initial Note (Addendum)
Transition of Care [facility]) - Initial/Assessment Note    Patient Details  Name: Monica Serrano MRN: [id number] Date of Birth: 19-Feb-1951  [facility]) CM/SW Contact:    Monica Sabal, RN Phone Number: 05/28/2020, 12:37 PM  Clinical Narrative:                Monica Serrano w patient at bedside to discuss disposition. She states she does not have assistance at home, and she will need to go to SNF. She would like [facility] if available [phone] opt 2. LVM requesting callback. Facility is over 80 miles away and transportation there may be cost prohibitive for the patient. I clarified with the patient, she states that she can have family drive her if she gets admitted there.  Patient would also be interested in [facility], and has agreed to be faxed out to all area SNFs.   Did not receive call back from [facility].  Please reach out to them for Fax number and to make referral.Please also check if they would allow family to transport via private car if she gets admitted.  Patient sent out in [place].      Expected Discharge Plan: Skilled Nursing Facility Barriers to Discharge: Continued Medical Work up   Patient Goals and CMS Choice Patient states their goals for this hospitalization and ongoing recovery are:: to go to rehab CMS Medicare.gov Compare Post Acute Care list provided to:: Patient Choice offered to / list presented to : Patient  Expected Discharge Plan and Services Expected Discharge Plan: Skilled Nursing Facility   Discharge Planning Services: CM Consult                                          Prior Living Arrangements/Services   Lives with:: Self Patient language and need for interpreter reviewed:: Yes              Criminal Activity/Legal Involvement Pertinent to Current Situation/Hospitalization: No - Comment as needed  Activities of Daily Living Home Assistive Devices/Equipment: Cane (specify quad or straight),Dentures (specify  type),Wheelchair,Walker (specify type) ADL Screening (condition at time of admission) Patient's cognitive ability adequate to safely complete daily activities?: Yes Is the patient deaf or have difficulty hearing?: No Does the patient have difficulty seeing, even when wearing glasses/contacts?: No Does the patient have difficulty concentrating, remembering, or making decisions?: No Patient able to express need for assistance with ADLs?: Yes Does the patient have difficulty dressing or bathing?: Yes Independently performs ADLs?: Yes (appropriate for developmental age) Does the patient have difficulty walking or climbing stairs?: Yes Weakness of Legs: Both Weakness of Arms/Hands: Both  Permission Sought/Granted Permission sought to share information with : Facility Industrial/product designer granted to share information with : Yes, Verbal Permission Granted              Emotional Assessment       Orientation: : Oriented to Self,Oriented to Place,Oriented to  Time,Oriented to Situation      Admission diagnosis:  Status post revision of total hip [Z96.649] Patient Active Problem List   Diagnosis Date Noted  . Status post revision of total hip 05/27/2020  . 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 Status post total replacement of right hip 03/21/2020  . Unilateral primary osteoarthritis, right hip 01/09/2020   PCP:  Monica Foster, MD Pharmacy:   [facility] -  [place], [place] - [address] [place] [address] Phone: [phone] Fax: [phone]     Social Determinants of Health (SDOH) Interventions    Readmission Risk Interventions No flowsheet data found.

## 2020-05-29 ENCOUNTER — Encounter (HOSPITAL_COMMUNITY): Payer: Self-pay | Admitting: Orthopaedic Surgery

## 2020-05-29 LAB — CBC
HCT: 24.1 % — ABNORMAL LOW (ref 36.0–46.0)
Hemoglobin: 7.5 g/dL — ABNORMAL LOW (ref 12.0–15.0)
MCH: 27.8 pg (ref 26.0–34.0)
MCHC: 31.1 g/dL (ref 30.0–36.0)
MCV: 89.3 fL (ref 80.0–100.0)
Platelets: 376 10*3/uL (ref 150–400)
RBC: 2.7 MIL/uL — ABNORMAL LOW (ref 3.87–5.11)
RDW: 14.8 % (ref 11.5–15.5)
WBC: 13.4 10*3/uL — ABNORMAL HIGH (ref 4.0–10.5)
nRBC: 0 % (ref 0.0–0.2)

## 2020-05-29 LAB — PREPARE RBC (CROSSMATCH)

## 2020-05-29 MED ORDER — FUROSEMIDE 10 MG/ML IJ SOLN
20.0000 mg | Freq: Once | INTRAMUSCULAR | Status: AC
  Administered 2020-05-29: 21:00:00 20 mg via INTRAVENOUS
  Filled 2020-05-29: qty 2

## 2020-05-29 MED ORDER — SODIUM CHLORIDE 0.9% IV SOLUTION: Freq: Once | INTRAVENOUS | Status: DC

## 2020-05-29 NOTE — TOC Progression Note (Addendum)
Transition of Care [facility]) - Progression Note    Patient Details  Name: Daurice Ovando MRN: [id number] Date of Birth: Jul 03, 1950  Transition of Care [facility]) CM/SW Contact  Janae Bridgeman, RN Phone Number: 05/29/2020, 3:09 PM  Clinical Narrative:    Case management called and left a message with [facility] at [phone] to request possible admission available for the patient and ask if the patient's family is able to transport the patient to the facility by car from [facility].  I left a voice mail with CM of admission to return my call so that I can fax clinicals to the facility accordingly.  Case management will continue to follow the patient for [facility] placement.  05/29/2020 1530-  I called [facility] in [place] and they do not have bed availability for the next 1.5 weeks.  I called and spoke with the patient and she is agreeable to go to [facility] in [place], [place].  I called and spoke with Rosemary Holms, CM and they will have a bed available to the patient on Monday, 06/01/2020.  I called [employer] and insurance authorization was started and clinicals were faxed to [phone] ref# [id number].    Expected Discharge Plan: Skilled Nursing Facility Barriers to Discharge: Continued Medical Work up  Expected Discharge Plan and Services Expected Discharge Plan: Skilled Nursing Facility   Discharge Planning Services: CM Consult                                           Social Determinants of Health (SDOH) Interventions    Readmission Risk Interventions No flowsheet data found.

## 2020-05-29 NOTE — Progress Notes (Signed)
PT Cancellation Note  Patient Details Name: Monica Serrano MRN: [id number] DOB: [birth year]   Cancelled Treatment:    Reason Eval/Treat Not Completed: Medical issues which prohibited therapy Patient with symptomatic anemia with Hgb 7.5 this AM. Currently receiving transfusion. PT will re-attempt session following completion of transfusion and improved Hgb level.   Gregor Hams, PT, DPT Acute Rehabilitation Services Pager [phone] Office [phone]    Elissa Lovett 05/29/2020, 12:48 PM

## 2020-05-29 NOTE — Plan of Care (Signed)

## 2020-05-29 NOTE — Progress Notes (Signed)
Patient ID: Monica Serrano, female   DOB: January 17, 1951, 69 y.o.   MRN: [id number] The patient does have acute blood loss anemia with a hemoglobin down to 7.5.  This is causing some slight acute renal insufficiency with a creatinine up to 1.3.  Her vital signs are stable but she is slightly fatigued.  I did talk to her about transfusion and she agrees with this.  The incisional VAC is stable on her left hip.  I am also continuing her antibiotics for least another 24 hours IV.  I will then transition her to oral antibiotics.

## 2020-05-30 LAB — CBC
HCT: 27.4 % — ABNORMAL LOW (ref 36.0–46.0)
Hemoglobin: 9.5 g/dL — ABNORMAL LOW (ref 12.0–15.0)
MCH: 29.1 pg (ref 26.0–34.0)
MCHC: 34.7 g/dL (ref 30.0–36.0)
MCV: 84 fL (ref 80.0–100.0)
Platelets: 399 10*3/uL (ref 150–400)
RBC: 3.26 MIL/uL — ABNORMAL LOW (ref 3.87–5.11)
RDW: 15.7 % — ABNORMAL HIGH (ref 11.5–15.5)
WBC: 12.7 10*3/uL — ABNORMAL HIGH (ref 4.0–10.5)
nRBC: 0 % (ref 0.0–0.2)

## 2020-05-30 LAB — TYPE AND SCREEN
ABO/RH(D): B POS
Antibody Screen: NEGATIVE
Unit division: 0
Unit division: 0

## 2020-05-30 LAB — BPAM RBC
Blood Product Expiration Date: 202201142359
Blood Product Expiration Date: 202201142359
ISSUE DATE / TIME: 202112231102
ISSUE DATE / TIME: 202112231642
Unit Type and Rh: 7300
Unit Type and Rh: 7300

## 2020-05-30 NOTE — Plan of Care (Signed)
Patient up to the [facility], received IV lasix after 2nd unit of blood. Tolerated blood transfusion. Pain managed with oxycodone. No new drainage from wound vac. Problem: Education: Goal: Knowledge of General Education information will improve Description: Including pain rating scale, medication(s)/side effects and non-pharmacologic comfort measures Outcome: Progressing   Problem: Health Behavior/Discharge Planning: Goal: Ability to manage health-related needs will improve Outcome: Progressing   Problem: Clinical Measurements: Goal: Ability to maintain clinical measurements within normal limits will improve Outcome: Progressing Goal: Will remain free from infection Outcome: Progressing Goal: Diagnostic test results will improve Outcome: Progressing Goal: Respiratory complications will improve Outcome: Progressing Goal: Cardiovascular complication will be avoided Outcome: Progressing   Problem: Activity: Goal: Risk for activity intolerance will decrease Outcome: Progressing   Problem: Nutrition: Goal: Adequate nutrition will be maintained Outcome: Progressing   Problem: Coping: Goal: Level of anxiety will decrease Outcome: Progressing   Problem: Elimination: Goal: Will not experience complications related to bowel motility Outcome: Progressing Goal: Will not experience complications related to urinary retention Outcome: Progressing   Problem: Pain Managment: Goal: General experience of comfort will improve Outcome: Progressing   Problem: Safety: Goal: Ability to remain free from injury will improve Outcome: Progressing   Problem: Skin Integrity: Goal: Risk for impaired skin integrity will decrease Outcome: Progressing   Problem: Education: Goal: Knowledge of the prescribed therapeutic regimen will improve Outcome: Progressing Goal: Understanding of discharge needs will improve Outcome: Progressing Goal: Individualized Educational Video(s) Outcome: Progressing    Problem: Activity: Goal: Ability to avoid complications of mobility impairment will improve Outcome: Progressing Goal: Ability to tolerate increased activity will improve Outcome: Progressing   Problem: Clinical Measurements: Goal: Postoperative complications will be avoided or minimized Outcome: Progressing   Problem: Pain Management: Goal: Pain level will decrease with appropriate interventions Outcome: Progressing   Problem: Skin Integrity: Goal: Will show signs of wound healing Outcome: Progressing

## 2020-05-30 NOTE — Progress Notes (Signed)
Physical Therapy Treatment Patient Details Name: Monica Serrano MRN: [id number] DOB: December 10, 1950 Today's Date: 05/30/2020    History of Present Illness Patient is 69 y/o female with PMH of CKD, DVT, lymphedema, HTN, OA, sleep apnea, and vertigo. Patient underwent R THA on 10/15 due to longstanding and worsening pain in right hip starting 3 years ago. Patient s/p R THA revision on 12/21.    PT Comments    Pt supine on arrival, pleasantly agreeable to therapy session and with good participation and tolerance for mobility. Session focus on RLE exercises for strengthening and gait training. Pt with improved tolerance for EOB/OOB mobility this date, min guard for all mobility with increased time to perform transfers/gait and progressed gait distance to 43ft in room using RW. Pt performed supine/seated RLE therapeutic exercises with good tolerance, needing occasional active assist for proper technique, pt given THA HEP handout and encouraged to complete BID/TID as able (except for standing therex which is to be performed while therapist present). Pt denies dizziness with transfers, VSS per chart review in AM and not otherwise assessed. Pt continues to benefit from skilled rehab in a post acute setting to maximize functional gains before returning home.   Follow Up Recommendations  SNF;Supervision/Assistance - 24 hour;Supervision for mobility/OOB     Equipment Recommendations   (defer to post acute rehab)    Recommendations for Other Services       Precautions / Restrictions Precautions Precautions: Fall Restrictions Weight Bearing Restrictions: Yes RLE Weight Bearing: Weight bearing as tolerated    Mobility  Bed Mobility Overal bed mobility: Needs Assistance Bed Mobility: Supine to Sit     Supine to sit: Min guard;HOB elevated     General bed mobility comments: min guard for supine>sit, no physical assist required; increased time  Transfers Overall transfer level: Needs  assistance Equipment used: Rolling walker (2 wheeled) Transfers: Sit to/from Stand Sit to Stand: Min guard         General transfer comment: from EOB<>RW and RW>chair, no LOB but cues needed for hand placement and sequencing to reduce pain  Ambulation/Gait Ambulation/Gait assistance: Min guard Gait Distance (Feet): 30 Feet Assistive device: Rolling walker (2 wheeled) Gait Pattern/deviations: Step-to pattern;Antalgic Gait velocity: grossly <0.3 m/s   General Gait Details: good use of RW, slow pace, pt defers further 2/2 fatigue/R hip pain   Stairs             Wheelchair Mobility    Modified Rankin (Stroke Patients Only)       Balance Overall balance assessment: Mild deficits observed, not formally tested                                          Cognition Arousal/Alertness: Awake/alert Behavior During Therapy: WFL for tasks assessed/performed Overall Cognitive Status: Within Functional Limits for tasks assessed                                        Exercises Total Joint Exercises Ankle Circles/Pumps: AROM;Strengthening;Both;10 reps;Supine Quad Sets: AROM;Strengthening;Both;10 reps;Supine Gluteal Sets: AROM;Strengthening;Both;5 reps;Supine Towel Squeeze: AROM;Strengthening;Both;10 reps;Supine Short Arc Quad: AROM;Strengthening;Right;10 reps;Supine Heel Slides: AAROM;AROM;Strengthening;Right;10 reps;Supine (AA initially progressing to AROM) Hip ABduction/ADduction: AAROM;Strengthening;Right;10 reps;Supine (AA at times to keep R knee/toes pointed up) Long Arc Quad: AROM;Strengthening;Right;5 reps;Seated    General Comments General comments (skin  integrity, edema, etc.): pleasantly agreeable, remained up in chair and ice to R hip at end of session      Pertinent Vitals/Pain Pain Assessment: 0-10 Pain Score: 3  Pain Location: R hip Pain Descriptors / Indicators: Guarding;Grimacing;Other (Comment) ("stinging") Pain  Intervention(s): Monitored during session;Premedicated before session;Repositioned;Ice applied  HR/SpO2 WNL on RA per pulse ox, not dizzy.  Home Living                      Prior Function            PT Goals (current goals can now be found in the care plan section) Acute Rehab PT Goals Patient Stated Goal: to get better PT Goal Formulation: With patient Time For Goal Achievement: 06/11/20 Potential to Achieve Goals: Good Progress towards PT goals: Progressing toward goals    Frequency    Min 3X/week      PT Plan Current plan remains appropriate    Co-evaluation              AM-PAC PT "6 Clicks" Mobility   Outcome Measure  Help needed turning from your back to your side while in a flat bed without using bedrails?: A Little Help needed moving from lying on your back to sitting on the side of a flat bed without using bedrails?: A Little Help needed moving to and from a bed to a chair (including a wheelchair)?: A Little Help needed standing up from a chair using your arms (e.g., wheelchair or bedside chair)?: A Little Help needed to walk in hospital room?: A Little Help needed climbing 3-5 steps with a railing? : A Lot 6 Click Score: 17    End of Session Equipment Utilized During Treatment: Gait belt Activity Tolerance: Patient tolerated treatment well Patient left: with call bell/phone within reach;in chair;Other (comment) (ice to R hip) Nurse Communication: Mobility status PT Visit Diagnosis: Unsteadiness on feet (R26.81);Other abnormalities of gait and mobility (R26.89);Muscle weakness (generalized) (M62.81);Difficulty in walking, not elsewhere classified (R26.2)     Time: 8563-1497 PT Time Calculation (min) (ACUTE ONLY): 33 min  Charges:  $Gait Training: 8-22 mins $Therapeutic Exercise: 8-22 mins                     Joycelynn Fritsche P., PTA Acute Rehabilitation Services Pager: [phone] Office: [phone]   Angus Palms 05/30/2020, 11:51 AM

## 2020-05-30 NOTE — Progress Notes (Signed)
Patient ID: Monica Serrano, female   DOB: June 12, 1950, 69 y.o.   MRN: [id number] Patient is awake and alert this morning and sitting comfortably in a chair.  She does feel better overall.  Her hemoglobin is 9.5.  She responded well with a transfusion from yesterday.  The back is over her right hip incision and has a good seal.  My plan is to leave this on until Monday and discharge her to skilled nursing at that point.  On Sunday I will order a repeat Covid test and likely new hip x-rays as well.  I will possibly remove the VAC from her incision on Sunday as well versus Monday.

## 2020-05-31 NOTE — Progress Notes (Signed)
Patient ID: Monica Serrano, female   DOB: Feb 13, 1951, 69 y.o.   MRN: [id number] The patient is awake and alert.  There is been no acute changes in her status over the last 24 hours.  Her vital signs are stable.  She reports only mild to moderate right hip pain.  Tomorrow morning I will recheck her labs.  The goal is to be able to send her to short-term skilled nursing early next week.  We will also order a COVID-19 test tomorrow and potentially remove the incisional VAC.

## 2020-06-01 ENCOUNTER — Inpatient Hospital Stay (HOSPITAL_COMMUNITY): Payer: Medicare Other

## 2020-06-01 LAB — SARS CORONAVIRUS 2 BY RT PCR (HOSPITAL ORDER, PERFORMED IN ~~LOC~~ HOSPITAL LAB): SARS Coronavirus 2: NEGATIVE

## 2020-06-01 IMAGING — DX DG PORTABLE PELVIS
1 series · 2 of 2 positions shown · non-contrast
Comparison: [DATE]

CLINICAL DATA: Status post right hip replacement

EXAM:
PORTABLE PELVIS 1-2 VIEWS

[Series 1: pelvis · 0.14mm/px · 2 of 2 slices shown]
[im 1/2]
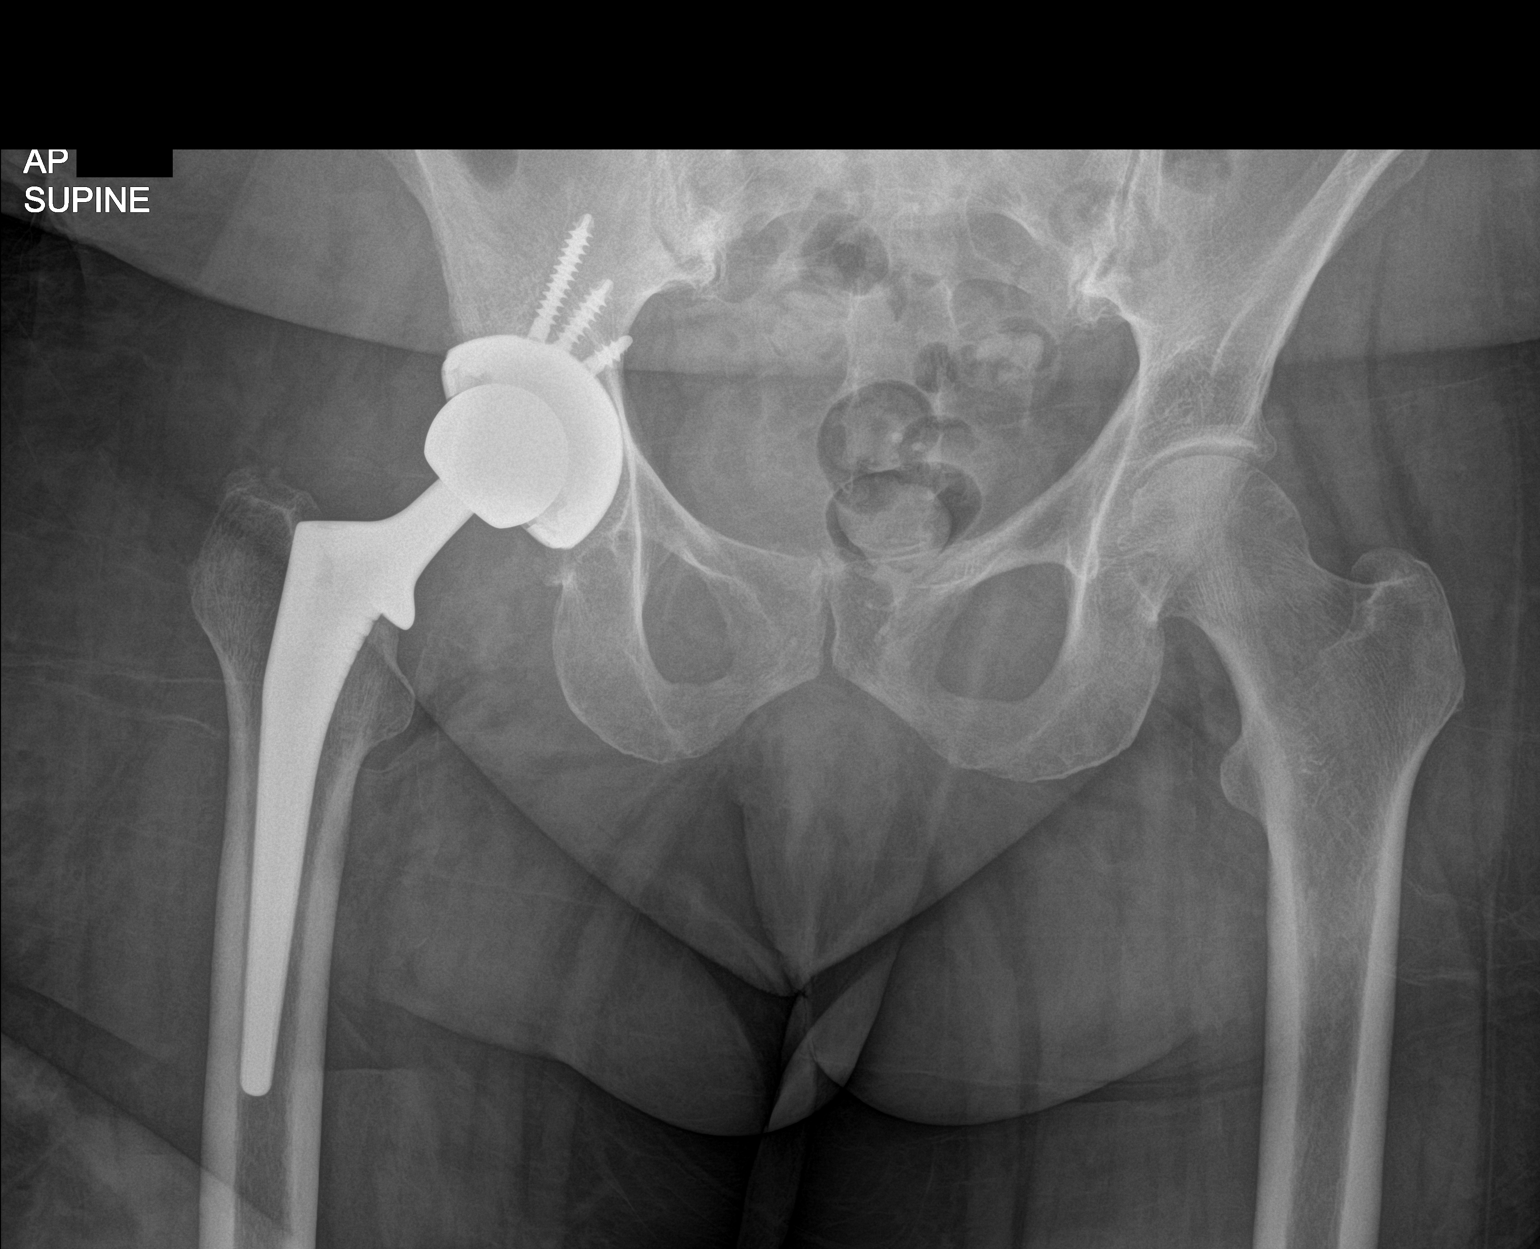
[im 2/2]
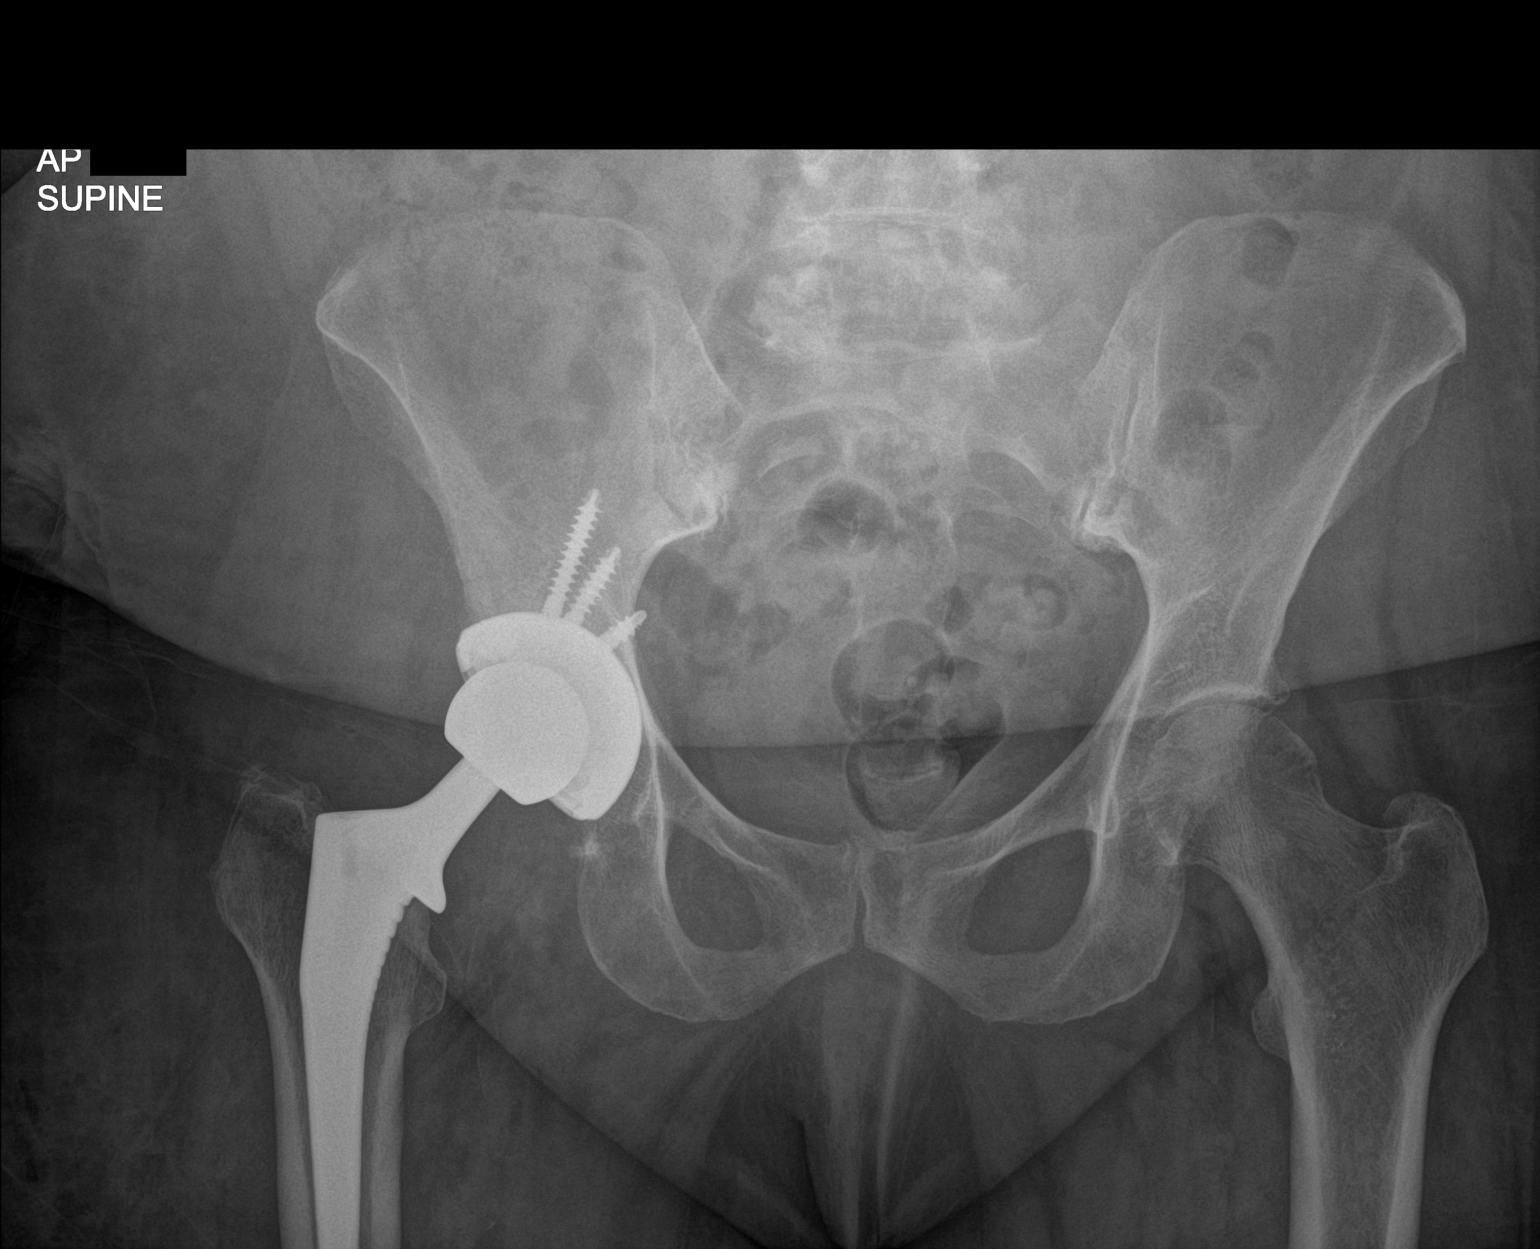

[2 of 2 positions shown; findings below may reference images not displayed]

FINDINGS: Pelvic ring is intact. Right hip replacement is noted and stable
from the prior study. No soft tissue abnormality is noted.
IMPRESSION: Status post right hip replacement.  No acute abnormality noted.

## 2020-06-01 NOTE — Progress Notes (Signed)
Physical Therapy Treatment Patient Details Name: Monica Serrano MRN: [id number] DOB: [birth year] Today's Date: 06/01/2020    History of Present Illness Patient is 69 y/o female with PMH of CKD, DVT, lymphedema, HTN, OA, sleep apnea, and vertigo. Patient underwent R THA on 10/15 due to longstanding and worsening pain in right hip starting 3 years ago. Patient s/p R THA revision on 12/21.    PT Comments    Pt progressing with mobility. She would very much like to get rehab at a SNF prior to DC home alone. PT in agreement with short-term rehab.     Follow Up Recommendations  SNF;Supervision/Assistance - 24 hour;Supervision for mobility/OOB     Equipment Recommendations  None recommended by PT    Recommendations for Other Services       Precautions / Restrictions Precautions Precautions: Fall;Other (comment) (per Dr. Eliberto Ivory note on 12/26-No exercises for R hip, other than walking and standing) Restrictions Weight Bearing Restrictions: Yes RLE Weight Bearing: Weight bearing as tolerated    Mobility  Bed Mobility Overal bed mobility:  (NT, put up in chair upon arrival and wanted to return to chair after session)                Transfers Overall transfer level: Needs assistance Equipment used: Rolling walker (2 wheeled) Transfers: Sit to/from Stand Sit to Stand: Min guard         General transfer comment:  (Pt used safe technique)  Ambulation/Gait Ambulation/Gait assistance: Min guard Gait Distance (Feet): 120 Feet (Also walked 77ft in room) Assistive device: Rolling walker (2 wheeled) Gait Pattern/deviations: Step-to pattern;Antalgic     General Gait Details:  (pt had trouble clearing RLE for first third of walk, but once she focused on that she was able to clear R foot consistently)   Stairs             Wheelchair Mobility    Modified Rankin (Stroke Patients Only)       Balance                                             Cognition Arousal/Alertness: Awake/alert Behavior During Therapy: WFL for tasks assessed/performed Overall Cognitive Status: Within Functional Limits for tasks assessed                                        Exercises      General Comments General comments (skin integrity, edema, etc.): Pt motivated to return to independence      Pertinent Vitals/Pain Pain Assessment: 0-10 Pain Score: 3  Pain Location: R hip insicion Pain Descriptors / Indicators: Other (Comment) (Stinging) Pain Intervention(s): Limited activity within patient's tolerance;Monitored during session    Home Living                      Prior Function            PT Goals (current goals can now be found in the care plan section) Acute Rehab PT Goals Patient Stated Goal: Pt wants to go to a rehab facility priot to DC home as she had difficulties at home after her last surgery Progress towards PT goals: Progressing toward goals    Frequency    Min 5X/week      PT Plan  Current plan remains appropriate    Co-evaluation              AM-PAC PT "6 Clicks" Mobility   Outcome Measure  Help needed turning from your back to your side while in a flat bed without using bedrails?: A Little Help needed moving from lying on your back to sitting on the side of a flat bed without using bedrails?: A Little Help needed moving to and from a bed to a chair (including a wheelchair)?: A Little Help needed standing up from a chair using your arms (e.g., wheelchair or bedside chair)?: A Little Help needed to walk in hospital room?: A Little Help needed climbing 3-5 steps with a railing? : A Lot 6 Click Score: 17    End of Session Equipment Utilized During Treatment: Gait belt Activity Tolerance: Patient tolerated treatment well Patient left: in chair;with call bell/phone within reach   PT Visit Diagnosis: Unsteadiness on feet (R26.81);Other abnormalities of gait and mobility (R26.89);Muscle  weakness (generalized) (M62.81);Difficulty in walking, not elsewhere classified (R26.2)     Time: 7564-3329 PT Time Calculation (min) (ACUTE ONLY): 24 min  Charges:  $Gait Training: 23-37 mins                     Lavona Mound, PT   Acute Rehabilitation Services  Pager [phone] Office [phone] 06/01/2020   Donnella Sham 06/01/2020, 3:06 PM

## 2020-06-01 NOTE — Progress Notes (Signed)
Patient ID: Monica Serrano, female   DOB: 1951/01/11, 69 y.o.   MRN: [id number] The patient overall looks good this morning.  I was able to remove her incisional VAC from her right hip incision.  Her incision looks good.  I placed an Aquacel dressing.  I was also able to aspirate at least 120 cc of seroma fluid off of her right hip soft tissue.  Our plan is to hopefully proceed to short-term skilled nursing in the next 1 to 2 days pending bed availability.  I will order new plain films today to assess her right hip revision.  She will continue to weight-bear as tolerated but no hip exercises other than just balance and coordination with her weightbearing.  She reports feeling much better overall in terms of the pain she was having in her hip preoperative.

## 2020-06-01 NOTE — Plan of Care (Signed)
Patient is alert and oriented x4. Up to the bedside commode. Wound vac intact, no new drainage noted. Swelling to right hip, patient stated possible d/c of wound vac in the morning. Denies any pain. Remains on IV antibiotics. Problem: Education: Goal: Knowledge of General Education information will improve Description: Including pain rating scale, medication(s)/side effects and non-pharmacologic comfort measures Outcome: Progressing   Problem: Health Behavior/Discharge Planning: Goal: Ability to manage health-related needs will improve Outcome: Progressing   Problem: Clinical Measurements: Goal: Ability to maintain clinical measurements within normal limits will improve Outcome: Progressing Goal: Will remain free from infection Outcome: Progressing Goal: Diagnostic test results will improve Outcome: Progressing Goal: Respiratory complications will improve Outcome: Progressing Goal: Cardiovascular complication will be avoided Outcome: Progressing   Problem: Activity: Goal: Risk for activity intolerance will decrease Outcome: Progressing   Problem: Nutrition: Goal: Adequate nutrition will be maintained Outcome: Progressing   Problem: Coping: Goal: Level of anxiety will decrease Outcome: Progressing   Problem: Elimination: Goal: Will not experience complications related to bowel motility Outcome: Progressing Goal: Will not experience complications related to urinary retention Outcome: Progressing   Problem: Pain Managment: Goal: General experience of comfort will improve Outcome: Progressing   Problem: Safety: Goal: Ability to remain free from injury will improve Outcome: Progressing   Problem: Skin Integrity: Goal: Risk for impaired skin integrity will decrease Outcome: Progressing   Problem: Education: Goal: Knowledge of the prescribed therapeutic regimen will improve Outcome: Progressing Goal: Understanding of discharge needs will improve Outcome:  Progressing Goal: Individualized Educational Video(s) Outcome: Progressing   Problem: Activity: Goal: Ability to avoid complications of mobility impairment will improve Outcome: Progressing Goal: Ability to tolerate increased activity will improve Outcome: Progressing   Problem: Clinical Measurements: Goal: Postoperative complications will be avoided or minimized Outcome: Progressing   Problem: Pain Management: Goal: Pain level will decrease with appropriate interventions Outcome: Progressing   Problem: Skin Integrity: Goal: Will show signs of wound healing Outcome: Progressing

## 2020-06-02 MED ORDER — DOXYCYCLINE HYCLATE 100 MG PO TABS: 100.0000 mg | ORAL_TABLET | Freq: Two times a day (BID) | ORAL | 0 refills | Status: DC

## 2020-06-02 MED ORDER — ASPIRIN 81 MG PO CHEW: 81.0000 mg | CHEWABLE_TABLET | Freq: Two times a day (BID) | ORAL | 0 refills | Status: DC

## 2020-06-02 MED ORDER — OXYCODONE HCL 5 MG PO TABS: 5.0000 mg | ORAL_TABLET | Freq: Four times a day (QID) | ORAL | 0 refills | Status: DC | PRN

## 2020-06-02 NOTE — Care Management Important Message (Signed)
Important Message  Patient Details  Name: Monica Serrano MRN: [id number] Date of Birth: 1950-10-06   Medicare Important Message Given:  Yes     Annison Birchard Stefan Church 06/02/2020, 2:55 PM

## 2020-06-02 NOTE — Progress Notes (Signed)
Report given to [facility], staff nurse at  [employer]. All questions and concerns were fully answered.

## 2020-06-02 NOTE — Progress Notes (Signed)
Patient ID: Monica Serrano, female   DOB: 1951-06-02, 69 y.o.   MRN: [id number] The patient continues to improve with her mobility.  Her x-rays after several days of mobility show that the right acetabular component is stable.  She continues to weight-bear as tolerated.  She still needs short-term skilled nursing.  My goal is to hopefully discharge her to a skilled nursing facility tomorrow.  Her recent Covid test yesterday was negative.  I did express seroma fluid from her hip incision which is to be expected given her morbid obesity.

## 2020-06-02 NOTE — TOC Transition Note (Signed)
Transition of Care [facility]) - CM/SW Discharge Note   Patient Details  Name: Monica Serrano MRN: [id number] Date of Birth: 1951/05/29  [facility] CM/SW Contact:  Carley Hammed, LCSWA Phone Number: 06/02/2020, 3:52 PM   Clinical Narrative:    Nurse to call report to [phone] Rm 704   Final next level of care: Skilled Nursing Facility Barriers to Discharge: SNF Pending transportation   Patient Goals and CMS Choice Patient states their goals for this hospitalization and ongoing recovery are:: to go to rehab CMS Medicare.gov Compare Post Acute Care list provided to:: Patient Choice offered to / list presented to : Patient  Discharge Placement              Patient chooses bed at: [facility],  Patient to be transferred to facility by: [facility] Name of family member notified: Patient Patient and family notified of of transfer: 06/02/20  Discharge Plan and Services   Discharge Planning Services: CM Consult                                 Social Determinants of Health (SDOH) Interventions     Readmission Risk Interventions No flowsheet data found.

## 2020-06-02 NOTE — Progress Notes (Signed)
Physical Therapy Treatment Patient Details Name: Monica Serrano MRN: [id number] DOB: 1950/10/07 Today's Date: 06/02/2020    History of Present Illness Patient is 69 y/o female with PMH of CKD, DVT, lymphedema, HTN, OA, sleep apnea, and vertigo. Patient underwent R THA on 10/15 due to longstanding and worsening pain in right hip starting 3 years ago. Patient s/p R THA revision on 12/21.    PT Comments    Patient making good progress with acute PT and eager to mobilize and regain independence. Patient educated on benefits of ambulation and to avoid exercises for ROM to Rt hip at this time per MD. Patient ambulated ~ 220' with RW and cues for safe proximity/management of walker. No overt LOB noted and pt progressed to step through gait pattern. Ice provided at EOS and pt encouraged to sit in up recliner as long as she wanted to. Acute PT will continue to progress as able, recommendation for SNF follow up remains appropriate.    Follow Up Recommendations  SNF;Supervision/Assistance - 24 hour;Supervision for mobility/OOB     Equipment Recommendations  None recommended by PT    Recommendations for Other Services       Precautions / Restrictions Precautions Precautions: Fall;Other (comment) (per Dr. Eliberto Ivory note on 12/26-No exercises for R hip, other than walking and standing) Restrictions Weight Bearing Restrictions: Yes RLE Weight Bearing: Weight bearing as tolerated    Mobility  Bed Mobility Overal bed mobility: Needs Assistance Bed Mobility: Supine to Sit              Transfers Overall transfer level: Needs assistance Equipment used: Rolling walker (2 wheeled) Transfers: Sit to/from Stand Sit to Stand: Min guard         General transfer comment:  (Pt used safe technique)  Ambulation/Gait Ambulation/Gait assistance: Min guard Gait Distance (Feet): 220 Feet Assistive device: Rolling walker (2 wheeled) Gait Pattern/deviations: Antalgic;Decreased stride  length;Step-through pattern Gait velocity: decr   General Gait Details: pt progressing gait to a step through pattern with decreased stride length and cues to stay close to RW. no overt LOB noted and min guard for safety.   Stairs             Wheelchair Mobility    Modified Rankin (Stroke Patients Only)       Balance                                            Cognition Arousal/Alertness: Awake/alert Behavior During Therapy: WFL for tasks assessed/performed Overall Cognitive Status: Within Functional Limits for tasks assessed                                        Exercises      General Comments General comments (skin integrity, edema, etc.): pt happy to end session up in chair.      Pertinent Vitals/Pain Pain Assessment: Faces Faces Pain Scale: Hurts little more Pain Location: R hip insicion Pain Descriptors / Indicators: Other (Comment);Burning (Stinging) Pain Intervention(s): Limited activity within patient's tolerance;Monitored during session;Repositioned;Ice applied    Home Living                      Prior Function            PT Goals (current goals can  now be found in the care plan section) Acute Rehab PT Goals Patient Stated Goal: Pt wants to go to a rehab facility prior to DC home as she had difficulties at home after her last surgery PT Goal Formulation: With patient Time For Goal Achievement: 06/11/20 Potential to Achieve Goals: Good Progress towards PT goals: Progressing toward goals    Frequency    Min 5X/week      PT Plan Current plan remains appropriate    Co-evaluation              AM-PAC PT "6 Clicks" Mobility   Outcome Measure  Help needed turning from your back to your side while in a flat bed without using bedrails?: A Little Help needed moving from lying on your back to sitting on the side of a flat bed without using bedrails?: A Little Help needed moving to and from a bed  to a chair (including a wheelchair)?: A Little Help needed standing up from a chair using your arms (e.g., wheelchair or bedside chair)?: A Little Help needed to walk in hospital room?: A Little Help needed climbing 3-5 steps with a railing? : A Lot 6 Click Score: 17    End of Session Equipment Utilized During Treatment: Gait belt Activity Tolerance: Patient tolerated treatment well Patient left: in chair;with call bell/phone within reach Nurse Communication: Mobility status PT Visit Diagnosis: Unsteadiness on feet (R26.81);Other abnormalities of gait and mobility (R26.89);Muscle weakness (generalized) (M62.81);Difficulty in walking, not elsewhere classified (R26.2)     Time: 1610-9604 PT Time Calculation (min) (ACUTE ONLY): 23 min  Charges:  $Gait Training: 23-37 mins                     Wynn Maudlin, DPT Acute Rehabilitation Services Office [phone] Pager [phone]     Anitra Lauth 06/02/2020, 4:20 PM

## 2020-06-02 NOTE — Discharge Summary (Signed)
Patient ID: Monica Serrano MRN: [id number] DOB/AGE: [birth year] 70 y.o.  Admit date: 05/27/2020 Discharge date: 06/02/2020  Admission Diagnoses:  Principal Problem:   Failed right total hip arthroplasty  Active Problems:   Status post revision of total hip   Discharge Diagnoses:  Same  Past Medical History:  Diagnosis Date  . Anxiety   . CKD (chronic kidney disease)   . Depression   . DVT, lower extremity (HCC) 2019   right leg  . 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 Headache    Migraines - years ago  . History of kidney stones    passed  . Hyperlipemia   . Hypertension   . Lymphedema   . Osteoarthritis   . Sleep apnea   . Vertigo     Surgeries: Procedure(s): REVISION ACETABULAR COMPONENT RIGHT HIP PLACEMENT OF WOUND VAC on 05/27/2020   Consultants:   Discharged Condition: Improved  Hospital Course: Monica Serrano is an 69 y.o. female who was admitted 05/27/2020 for operative treatment ofFailed total hip arthroplasty, subsequent encounter. Patient has severe unremitting pain that affects sleep, daily activities, and work/hobbies. After pre-op clearance the patient was taken to the operating room on 05/27/2020 and underwent  Procedure(s): REVISION ACETABULAR COMPONENT RIGHT HIP PLACEMENT OF WOUND VAC.    Patient was given perioperative antibiotics:  Anti-infectives (From admission, onward)   Start     Dose/Rate Route Frequency Ordered Stop   06/02/20 0000  doxycycline (VIBRA-TABS) 100 MG tablet        100 mg Oral 2 times daily 06/02/20 0740     05/28/20 0800  fluconazole (DIFLUCAN) tablet 150 mg        150 mg Oral  Once 05/28/20 0719 05/28/20 0948   05/28/20 0600  ceFAZolin (ANCEF) IVPB 2g/100 mL premix        2 g 200 mL/hr over 30 Minutes Intravenous On call to O.R. 05/27/20 1256 05/27/20 1515   05/27/20 2200  ceFAZolin (ANCEF) IVPB 1 g/50 mL premix  Status:  Discontinued        1 g 100 mL/hr over 30 Minutes Intravenous Every 8 hours 05/27/20 1847  06/02/20 0740   05/27/20 1559  vancomycin (VANCOCIN) powder  Status:  Discontinued          As needed 05/27/20 1602 05/27/20 1744   05/27/20 1259  ceFAZolin (ANCEF) 2-4 GM/100ML-% IVPB       Note to Pharmacy: Kandice Hams   : cabinet override      05/27/20 1259 05/27/20 1528       Patient was given sequential compression devices, early ambulation, and chemoprophylaxis to prevent DVT.  Patient benefited maximally from hospital stay and there were no complications.    Recent vital signs:  Patient Vitals for the past 24 hrs:  BP Temp Temp src Pulse Resp SpO2  06/02/20 0746 (!) 114/58 98.8 F (37.1 C) -- 88 17 100 %  06/02/20 0500 119/89 97.9 F (36.6 C) Oral 96 18 97 %  06/01/20 2004 (!) 119/52 98.5 F (36.9 C) Oral 91 16 100 %     Recent laboratory studies: No results for input(s): WBC, HGB, HCT, PLT, NA, K, CL, CO2, BUN, CREATININE, GLUCOSE, INR, CALCIUM in the last 72 hours.  Invalid input(s): PT, 2   Discharge Medications:   Allergies as of 06/02/2020   No Known Allergies     Medication List    STOP taking these medications   aspirin EC 81 MG tablet Replaced by: aspirin 81 MG  chewable tablet   oxyCODONE-acetaminophen 7.5-325 MG tablet Commonly known as: PERCOCET     TAKE these medications   acetaminophen 500 MG tablet Commonly known as: TYLENOL Take 1,000 mg by mouth every 8 (eight) hours as needed for moderate pain or headache.   aspirin 81 MG chewable tablet Chew 1 tablet (81 mg total) by mouth 2 (two) times daily. Replaces: aspirin EC 81 MG tablet   atorvastatin 20 MG tablet Commonly known as: LIPITOR Take 20 mg by mouth daily.   candesartan 16 MG tablet Commonly known as: ATACAND Take 16 mg by mouth daily.   cholecalciferol 25 MCG (1000 UNIT) tablet Commonly known as: VITAMIN D3 Take 2,000 Units by mouth daily.   docusate sodium 100 MG capsule Commonly known as: COLACE Take 100 mg by mouth daily.   doxycycline 100 MG tablet Commonly known  as: VIBRA-TABS Take 1 tablet (100 mg total) by mouth 2 (two) times daily.   DULoxetine 60 MG capsule Commonly known as: CYMBALTA Take 60 mg by mouth daily.   furosemide 40 MG tablet Commonly known as: LASIX Take 40 mg by mouth daily.   gabapentin 300 MG capsule Commonly known as: NEURONTIN Take 300 mg by mouth 2 (two) times daily.   lansoprazole 30 MG capsule Commonly known as: PREVACID Take 30 mg by mouth 2 (two) times daily.   ondansetron 4 MG disintegrating tablet Commonly known as: Zofran ODT Take 1 tablet (4 mg total) by mouth every 8 (eight) hours as needed for nausea or vomiting.   oxyCODONE 5 MG immediate release tablet Commonly known as: Oxy IR/ROXICODONE Take 1-2 tablets (5-10 mg total) by mouth every 6 (six) hours as needed for moderate pain (pain score 4-6).   rOPINIRole 1 MG tablet Commonly known as: REQUIP Take 1 mg by mouth at bedtime.   spironolactone 25 MG tablet Commonly known as: ALDACTONE Take 25 mg by mouth daily.            Durable Medical Equipment  (From admission, onward)         Start     Ordered   05/27/20 1848  DME 3 n 1  Once        05/27/20 1847   05/27/20 1848  DME Walker rolling  Once       Question Answer Comment  Walker: With 5 Inch Wheels   Patient needs a walker to treat with the following condition Status post revision of total hip      05/27/20 1847          Diagnostic Studies: DG Pelvis Portable  Result Date: 06/01/2020 CLINICAL DATA:  Status post right hip replacement EXAM: PORTABLE PELVIS 1-2 VIEWS COMPARISON:  05/27/2020 FINDINGS: Pelvic ring is intact. Right hip replacement is noted and stable from the prior study. No soft tissue abnormality is noted. IMPRESSION: Status post right hip replacement.  No acute abnormality noted. Electronically Signed   By: Alcide Clever M.D.   On: 06/01/2020 09:42   DG Pelvis Portable  Result Date: 05/27/2020 CLINICAL DATA:  Right hip arthroplasty EXAM: PORTABLE PELVIS 1-2 VIEWS  COMPARISON:  03/21/2020 FINDINGS: Single frontal view of the pelvis demonstrates revision of right hip arthroplasty in the expected position without signs of acute complication. Postsurgical changes are seen in the soft tissues. Stable left hip osteoarthritis. IMPRESSION: 1. Unremarkable right hip arthroplasty. Electronically Signed   By: Sharlet Salina M.D.   On: 05/27/2020 19:28   DG C-Arm 1-60 Min  Result Date: 05/27/2020 CLINICAL DATA:  Surgery. EXAM: OPERATIVE RIGHT HIP (WITH PELVIS IF PERFORMED) 2 VIEWS TECHNIQUE: Fluoroscopic spot image(s) were submitted for interpretation post-operatively. COMPARISON:  Hip radiographs 05/19/2020. FINDINGS: Fluoro time: 37.9 seconds. Radiation: 2.8 mGy. Two C-arm fluoroscopic images were obtained intraoperatively and submitted for post operative interpretation. These images demonstrate postoperative changes of right total hip arthroplasty. Please see the performing provider's procedural report for further detail. IMPRESSION: Intraoperative fluoroscopic images, as detailed above. Electronically Signed   By: Feliberto Harts MD   On: 05/27/2020 17:15   DG HIP OPERATIVE UNILAT W OR W/O PELVIS RIGHT  Result Date: 05/27/2020 CLINICAL DATA:  Surgery. EXAM: OPERATIVE RIGHT HIP (WITH PELVIS IF PERFORMED) 2 VIEWS TECHNIQUE: Fluoroscopic spot image(s) were submitted for interpretation post-operatively. COMPARISON:  Hip radiographs 05/19/2020. FINDINGS: Fluoro time: 37.9 seconds. Radiation: 2.8 mGy. Two C-arm fluoroscopic images were obtained intraoperatively and submitted for post operative interpretation. These images demonstrate postoperative changes of right total hip arthroplasty. Please see the performing provider's procedural report for further detail. IMPRESSION: Intraoperative fluoroscopic images, as detailed above. Electronically Signed   By: Feliberto Harts MD   On: 05/27/2020 17:15   XR HIP UNILAT W OR W/O PELVIS 1V RIGHT  Result Date: 05/19/2020 An AP  pelvis that is standing compared to a supine pelvis at the time of surgery shows that the acetabular component seems to be in a different position.  This is suggestive of component loosening.  XR Knee 1-2 Views Right  Result Date: 05/19/2020 2 views of the right knee show total knee arthroplasty with no acute findings.  There is no resurfacing of the patella.  XR Lumbar Spine 2-3 Views  Result Date: 05/19/2020 2 views of the lumbar spine show a spondylolisthesis between L4 and L5 that is grade 1.  There is otherwise no acute findings.   Disposition: Discharge disposition: 03-Skilled Nursing Facility            Signed: Kathryne Hitch 06/02/2020, 2:57 PM

## 2020-06-02 NOTE — Progress Notes (Signed)
Discharge summary packet/pertinent documents provided to [facility] staff. D/C to [facility] as ordered. Pt remains alert/oriented in no apparent distress, surgica wound site remains CDI, No complaints voiced.

## 2020-06-03 ENCOUNTER — Telehealth: Payer: Self-pay | Admitting: Orthopaedic Surgery

## 2020-06-03 NOTE — Telephone Encounter (Signed)
She needs to be on those for two weeks.

## 2020-06-03 NOTE — Telephone Encounter (Signed)
LMOM of the below message  

## 2020-06-03 NOTE — Telephone Encounter (Signed)
Heather from [facility] called. Patient got there today. She would like to know the stop dates on the aspirin and doxycycline. Her call back number is [phone] ext [phone]

## 2020-06-05 ENCOUNTER — Telehealth: Payer: Self-pay

## 2020-06-05 NOTE — Telephone Encounter (Signed)
HH nurse called to inform us that she did replace the bandage with another water proof bandage since the other bandage was oversaturated and coming off.

## 2020-06-09 ENCOUNTER — Telehealth: Payer: Self-pay | Admitting: Radiology

## 2020-06-09 NOTE — Telephone Encounter (Signed)
Received call from [facility]. Patient is having some increasing drainage at area of sutures where she originally had wound vac. Per nurse there, they are currently doing daily dry dressing changes as the Aquacell was removed due to draining. Patient is on doxycycline. She does not have a fever or redness around the incision.  Patient is scheduled for appointment on Wednesday, 06/11/2020.  Work in appointment offered for tomorrow morning. They will have to check with driver to see if he is able to bring her at that time and will call back to advise.

## 2020-06-09 NOTE — Telephone Encounter (Signed)
.  FYI  Facility called back and appointment was made for wound check tomorrow morning at 0945.

## 2020-06-10 ENCOUNTER — Ambulatory Visit (INDEPENDENT_AMBULATORY_CARE_PROVIDER_SITE_OTHER): Payer: Medicare Other | Admitting: Orthopaedic Surgery

## 2020-06-10 ENCOUNTER — Ambulatory Visit (INDEPENDENT_AMBULATORY_CARE_PROVIDER_SITE_OTHER): Payer: Medicare Other

## 2020-06-10 DIAGNOSIS — Z96641 Presence of right artificial hip joint: Secondary | ICD-10-CM

## 2020-06-10 NOTE — Progress Notes (Signed)
The patient is 2 weeks today status post revision of a failed right hip acetabular component.  She is also developed a postoperative seroma with wound dehiscence.  We took her to the operating room 2 weeks ago and was able to revise the acetabular component.  She reports minimal pain at this point and is able to ambulate.  She has been recovering in a skilled nursing facility.  We kept her in the hospital for at least 5 to 6 days after surgery with an incisional VAC.  Examination of her right hip wound shows that it is draining serous fluid again.  There is some small area of dehiscence as well.  I agree with her having discharge soon from the skilled nursing facility with setting up her home health therapy since she is doing well.  She does need to get back to her wound center because they are going to need to back this area.  She will likely need a VAC for several weeks to several months to get this to completely heal.

## 2020-06-11 ENCOUNTER — Inpatient Hospital Stay: Payer: Medicare Other | Admitting: Orthopaedic Surgery

## 2020-06-12 ENCOUNTER — Telehealth: Payer: Self-pay

## 2020-06-12 NOTE — Telephone Encounter (Signed)
Heather from [facility] called she stated the facility received a order for a wound vac she stated they are capable to do it at the facility and does Dr.Blackman wants her to remove the sutures or put the wound vac over the entire area or just the open area. [phone]

## 2020-06-12 NOTE — Telephone Encounter (Signed)
Called and advised.

## 2020-06-12 NOTE — Telephone Encounter (Signed)
They can remove the sutures from just the open area and place the vac over that area.

## 2020-06-13 ENCOUNTER — Encounter: Payer: Self-pay | Admitting: Orthopaedic Surgery

## 2020-06-16 ENCOUNTER — Inpatient Hospital Stay: Payer: Medicare Other | Admitting: Orthopaedic Surgery

## 2020-06-19 ENCOUNTER — Ambulatory Visit (INDEPENDENT_AMBULATORY_CARE_PROVIDER_SITE_OTHER): Payer: Medicare Other | Admitting: Physician Assistant

## 2020-06-19 ENCOUNTER — Encounter: Payer: Self-pay | Admitting: Physician Assistant

## 2020-06-19 DIAGNOSIS — Z96641 Presence of right artificial hip joint: Secondary | ICD-10-CM

## 2020-06-19 MED ORDER — FLUCONAZOLE 100 MG PO TABS: 100.0000 mg | ORAL_TABLET | Freq: Every day | ORAL | 0 refills | Status: DC

## 2020-06-19 NOTE — Progress Notes (Signed)
HPI: [name] returns today follow-up status post right hip revision acetabular component and application of wound VAC 05/27/2020.  She is currently at home.  She is going to the wound center for wound VAC changes and also has home nurses are coming to change her wound VAC.  She states the hip overall is doing well.  She is ambulating with a walker.  She denies any fevers chills shortness of breath chest pain.  Physical exam: Right hip surgical incision proximal and distal incisions well approximated with interrupted nylon sutures.  Mid the proximal end of the body fold consistent with there is a large area of dehiscence and wound breakdown.  There is good granular tissue in this.  No significant malodor.  She has erythema at the proximal portion of her incision consistent with Candida.   Impression status post right hip revision acetabular component 05/27/2020  Plan: Wound VAC was changed today.  Sutures remain.  She will continue to be seen at wound care and have the wound care nurses come out to change her wound VAC out.  We will see her back in 1 month sooner if there is any questions or concerns.  Did send some Diflucan and for the Candida.

## 2020-06-29 ENCOUNTER — Encounter: Payer: Self-pay | Admitting: Orthopaedic Surgery

## 2020-06-30 ENCOUNTER — Ambulatory Visit: Payer: Medicare Other | Admitting: Orthopaedic Surgery

## 2020-07-14 ENCOUNTER — Telehealth: Payer: Self-pay

## 2020-07-14 NOTE — Telephone Encounter (Signed)
Monica Serrano hospitalitis called regarding last mesasge about patient  being transferred she would like a call back  [phone]

## 2020-07-14 NOTE — Telephone Encounter (Signed)
Pt daughter called back stating that the hospital her mom is at called [facility] and [facility] about her being transferred, they stated that they are not taking transfers at this time and Dr. Magnus Ivan would need to speak with the CMO of the hospital in order for the pt to be transferred.

## 2020-07-14 NOTE — Telephone Encounter (Signed)
Daughter wants to speak with you about her mom and her needing to be transferred. Just questions she wants to discuss with you

## 2020-07-14 NOTE — Telephone Encounter (Signed)
Patients daughter called she would like a call back regarding patients care call [phone]

## 2020-07-15 ENCOUNTER — Inpatient Hospital Stay (HOSPITAL_COMMUNITY): Payer: Medicare Other

## 2020-07-15 ENCOUNTER — Encounter (HOSPITAL_COMMUNITY): Payer: Self-pay | Admitting: Orthopaedic Surgery

## 2020-07-15 ENCOUNTER — Inpatient Hospital Stay (HOSPITAL_COMMUNITY)
Admission: AD | Admit: 2020-07-15 | Discharge: 2020-07-22 | DRG: 920 | Disposition: A | Discharge status: Home Health | Payer: Medicare Other | Source: Other Acute Inpatient Hospital | Attending: Orthopaedic Surgery | Admitting: Orthopaedic Surgery

## 2020-07-15 ENCOUNTER — Other Ambulatory Visit: Payer: Self-pay

## 2020-07-15 DIAGNOSIS — G2581 Restless legs syndrome: Secondary | ICD-10-CM | POA: Diagnosis not present

## 2020-07-15 DIAGNOSIS — B951 Streptococcus, group B, as the cause of diseases classified elsewhere: Secondary | ICD-10-CM | POA: Diagnosis present

## 2020-07-15 DIAGNOSIS — T8141XA Infection following a procedure, superficial incisional surgical site, initial encounter: Secondary | ICD-10-CM | POA: Diagnosis present

## 2020-07-15 DIAGNOSIS — M25451 Effusion, right hip: Secondary | ICD-10-CM

## 2020-07-15 DIAGNOSIS — E785 Hyperlipidemia, unspecified: Secondary | ICD-10-CM | POA: Diagnosis present

## 2020-07-15 DIAGNOSIS — Z86718 Personal history of other venous thrombosis and embolism: Secondary | ICD-10-CM | POA: Diagnosis not present

## 2020-07-15 DIAGNOSIS — T8130XA Disruption of wound, unspecified, initial encounter: Secondary | ICD-10-CM

## 2020-07-15 DIAGNOSIS — M1612 Unilateral primary osteoarthritis, left hip: Secondary | ICD-10-CM | POA: Diagnosis present

## 2020-07-15 DIAGNOSIS — B964 Proteus (mirabilis) (morganii) as the cause of diseases classified elsewhere: Secondary | ICD-10-CM | POA: Diagnosis present

## 2020-07-15 DIAGNOSIS — Z87891 Personal history of nicotine dependence: Secondary | ICD-10-CM

## 2020-07-15 DIAGNOSIS — T8149XA Infection following a procedure, other surgical site, initial encounter: Secondary | ICD-10-CM | POA: Diagnosis not present

## 2020-07-15 DIAGNOSIS — Z6841 Body Mass Index (BMI) 40.0 and over, adult: Secondary | ICD-10-CM

## 2020-07-15 DIAGNOSIS — N189 Chronic kidney disease, unspecified: Secondary | ICD-10-CM | POA: Diagnosis present

## 2020-07-15 DIAGNOSIS — T8489XA Other specified complication of internal orthopedic prosthetic devices, implants and grafts, initial encounter: Secondary | ICD-10-CM | POA: Diagnosis not present

## 2020-07-15 DIAGNOSIS — E669 Obesity, unspecified: Secondary | ICD-10-CM | POA: Diagnosis present

## 2020-07-15 DIAGNOSIS — T8132XA Disruption of internal operation (surgical) wound, not elsewhere classified, initial encounter: Secondary | ICD-10-CM | POA: Diagnosis present

## 2020-07-15 DIAGNOSIS — I129 Hypertensive chronic kidney disease with stage 1 through stage 4 chronic kidney disease, or unspecified chronic kidney disease: Secondary | ICD-10-CM | POA: Diagnosis present

## 2020-07-15 DIAGNOSIS — G4730 Sleep apnea, unspecified: Secondary | ICD-10-CM | POA: Diagnosis present

## 2020-07-15 DIAGNOSIS — I890 Lymphedema, not elsewhere classified: Secondary | ICD-10-CM | POA: Diagnosis not present

## 2020-07-15 DIAGNOSIS — Z9889 Other specified postprocedural states: Secondary | ICD-10-CM

## 2020-07-15 LAB — CBC
HCT: 28 % — ABNORMAL LOW (ref 36.0–46.0)
Hemoglobin: 8.7 g/dL — ABNORMAL LOW (ref 12.0–15.0)
MCH: 28.2 pg (ref 26.0–34.0)
MCHC: 31.1 g/dL (ref 30.0–36.0)
MCV: 90.6 fL (ref 80.0–100.0)
Platelets: 375 10*3/uL (ref 150–400)
RBC: 3.09 MIL/uL — ABNORMAL LOW (ref 3.87–5.11)
RDW: 15.4 % (ref 11.5–15.5)
WBC: 9.2 10*3/uL (ref 4.0–10.5)
nRBC: 0 % (ref 0.0–0.2)

## 2020-07-15 LAB — BASIC METABOLIC PANEL
Anion gap: 8 (ref 5–15)
BUN: 6 mg/dL — ABNORMAL LOW (ref 8–23)
CO2: 23 mmol/L (ref 22–32)
Calcium: 9.5 mg/dL (ref 8.9–10.3)
Chloride: 106 mmol/L (ref 98–111)
Creatinine, Ser: 0.85 mg/dL (ref 0.44–1.00)
GFR, Estimated: >60 mL/min (ref >60)
Glucose, Bld: 92 mg/dL (ref 70–99)
Potassium: 3.8 mmol/L (ref 3.5–5.1)
Sodium: 137 mmol/L (ref 135–145)

## 2020-07-15 LAB — SEDIMENTATION RATE: Sed Rate: 75 mm/hr — ABNORMAL HIGH (ref 0–22)

## 2020-07-15 LAB — C-REACTIVE PROTEIN: CRP: 11.5 mg/dL — ABNORMAL HIGH (ref <1.0)

## 2020-07-15 IMAGING — DX DG PORTABLE PELVIS
2 series · 2 of 2 positions shown · non-contrast
Comparison: [DATE]

CLINICAL DATA: Right hip arthroplasty, pain, wound dehiscence

EXAM:
PORTABLE PELVIS 1-2 VIEWS

[pelvis ap]
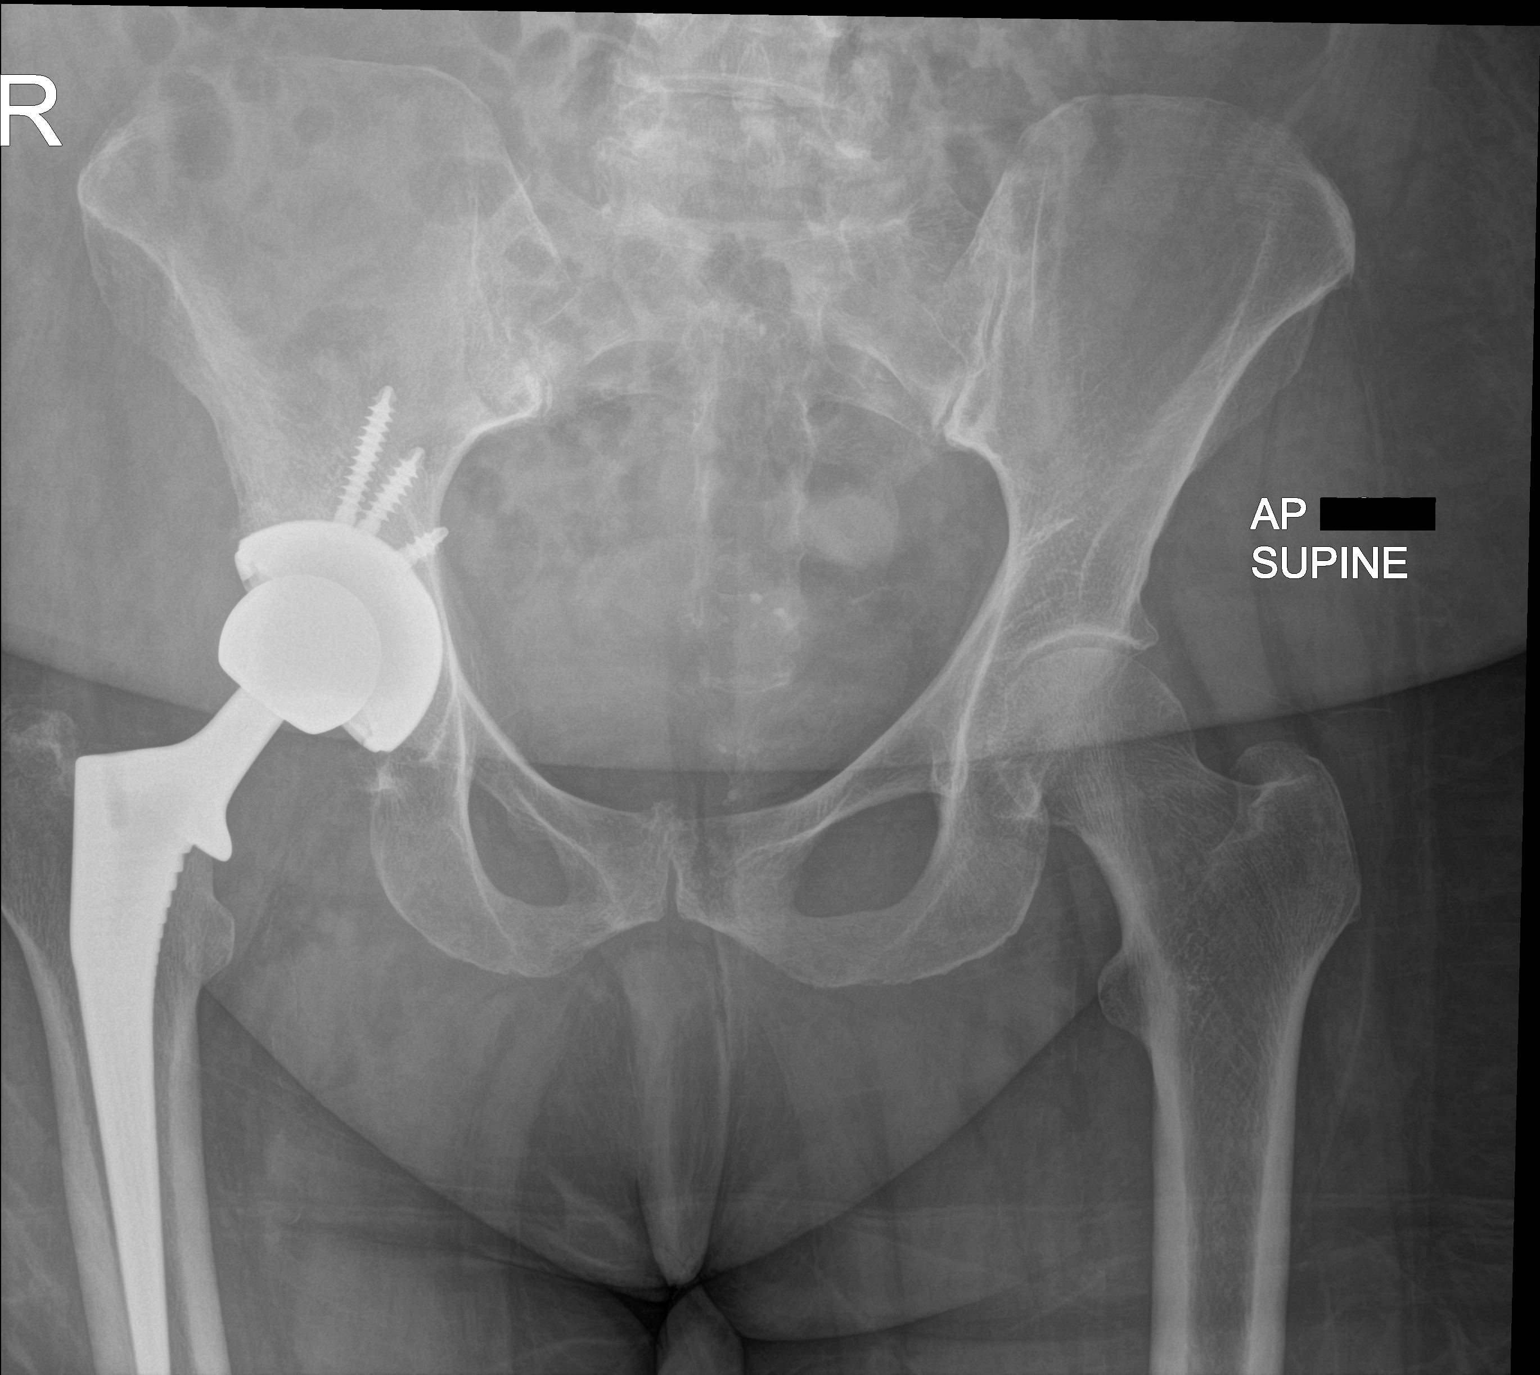

[pelvis lat]
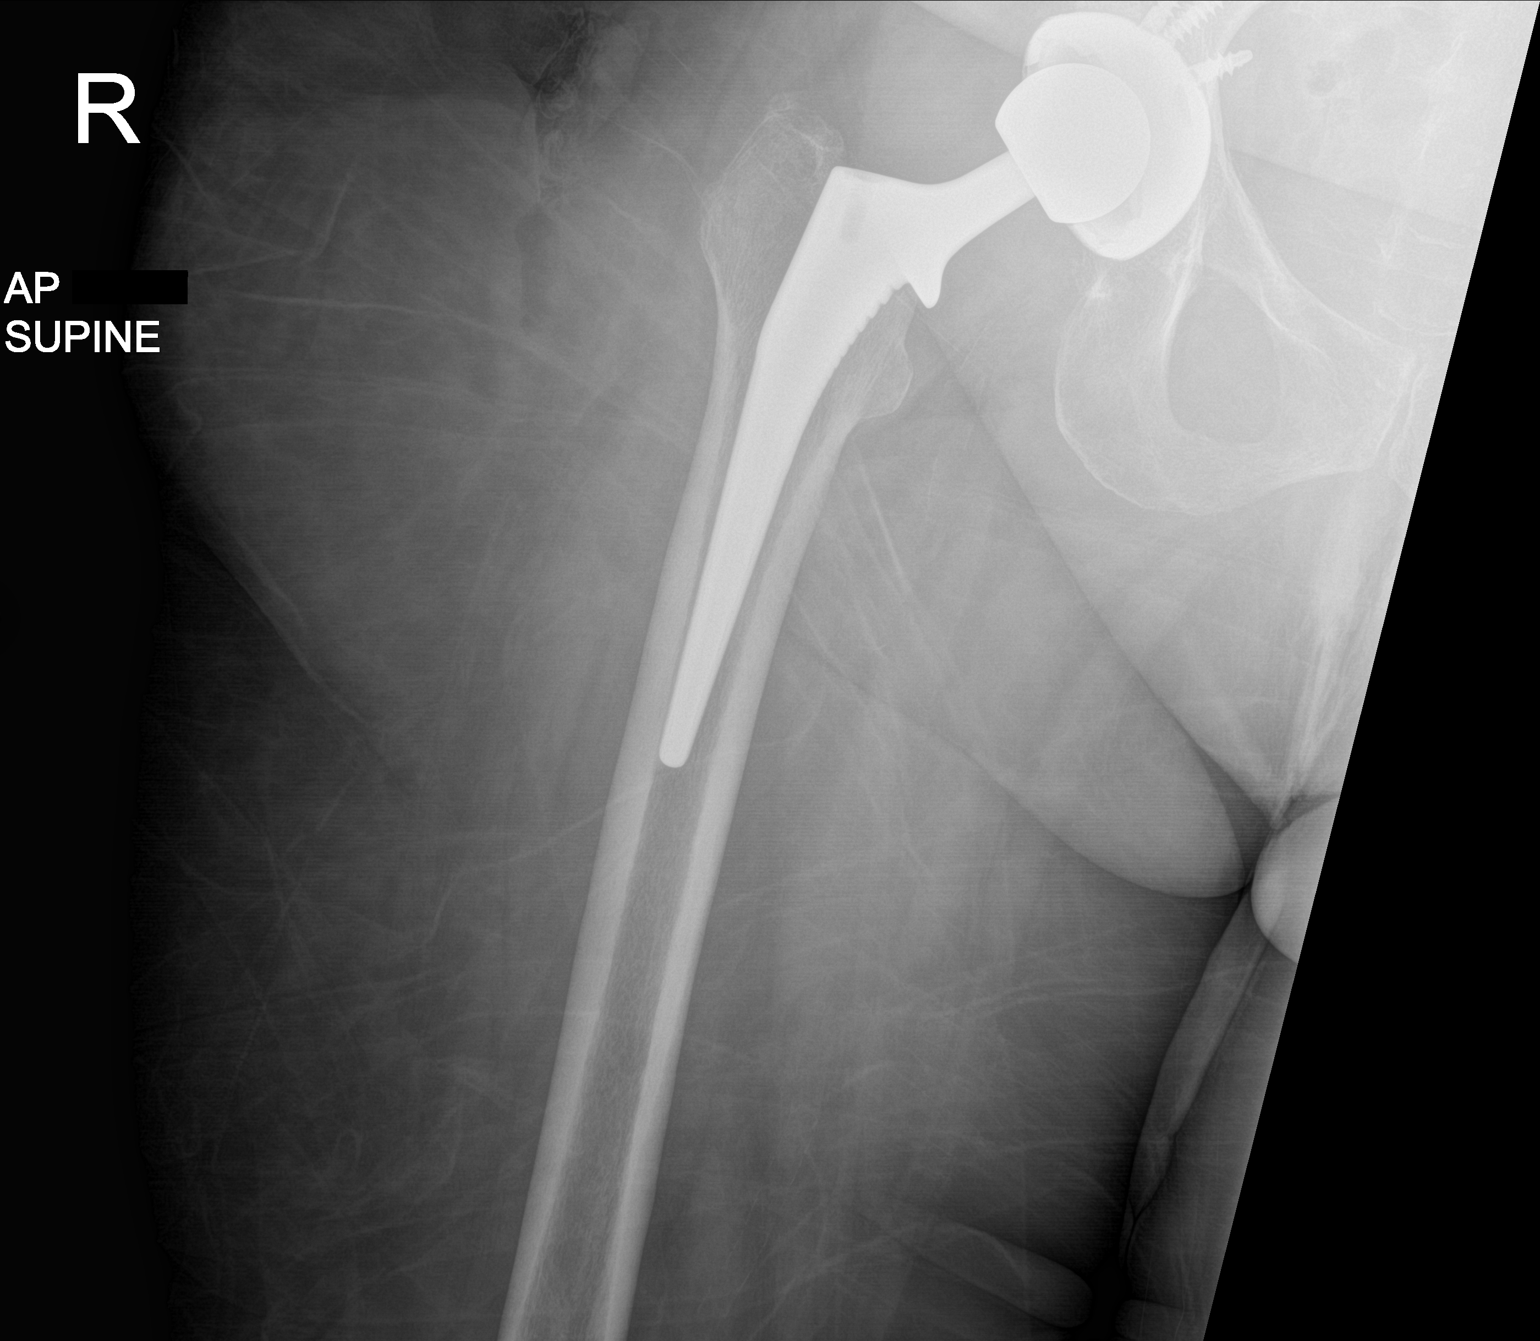

[2 of 2 positions shown; findings below may reference images not displayed]

FINDINGS: Frontal view of the pelvis and frontal view of the right hip are
obtained. Right hip arthroplasty is in the expected position without
signs of acute complication. No significant change since prior
study. No acute bony abnormality. Soft tissue swelling overlying the
right hip, with subcutaneous gas consistent with history of wound
dehiscence.
IMPRESSION: 1. Soft tissue swelling and subcutaneous gas overlying the right
lateral hip, consistent with given history of wound dehiscence.
2. Unremarkable right hip arthroplasty.

## 2020-07-15 MED ORDER — SODIUM CHLORIDE 0.9 % IV SOLN: INTRAVENOUS | Status: DC

## 2020-07-15 MED ORDER — HYDROMORPHONE HCL 1 MG/ML IJ SOLN
0.5000 mg | INTRAMUSCULAR | Status: DC | PRN
  Administered 2020-07-16 (×2): 1 mg via INTRAVENOUS
  Filled 2020-07-15 (×4): qty 1

## 2020-07-15 MED ORDER — METHOCARBAMOL 500 MG PO TABS
500.0000 mg | ORAL_TABLET | Freq: Four times a day (QID) | ORAL | Status: DC | PRN
  Administered 2020-07-15 – 2020-07-18 (×4): 500 mg via ORAL
  Filled 2020-07-15 (×4): qty 1

## 2020-07-15 MED ORDER — ACETAMINOPHEN 325 MG PO TABS
325.0000 mg | ORAL_TABLET | Freq: Four times a day (QID) | ORAL | Status: DC | PRN
  Administered 2020-07-15 – 2020-07-20 (×5): 650 mg via ORAL
  Filled 2020-07-15 (×7): qty 2

## 2020-07-15 MED ORDER — DIPHENHYDRAMINE HCL 12.5 MG/5ML PO ELIX
12.5000 mg | ORAL_SOLUTION | ORAL | Status: DC | PRN
Start: 2020-07-15 — End: 2020-07-22

## 2020-07-15 MED ORDER — METHOCARBAMOL 1000 MG/10ML IJ SOLN
500.0000 mg | Freq: Four times a day (QID) | INTRAVENOUS | Status: DC | PRN
  Filled 2020-07-15: qty 5

## 2020-07-15 MED ORDER — SODIUM CHLORIDE 0.9 % IV SOLN
2.0000 g | Freq: Every day | INTRAVENOUS | Status: DC
  Administered 2020-07-15 – 2020-07-22 (×8): 2 g via INTRAVENOUS
  Filled 2020-07-15 (×6): qty 2
  Filled 2020-07-15: qty 20
  Filled 2020-07-15: qty 2

## 2020-07-15 MED ORDER — OXYCODONE HCL 5 MG PO TABS
10.0000 mg | ORAL_TABLET | ORAL | Status: DC | PRN
  Administered 2020-07-15 – 2020-07-18 (×8): 15 mg via ORAL
  Administered 2020-07-19 – 2020-07-21 (×2): 10 mg via ORAL
  Filled 2020-07-15 (×7): qty 3
  Filled 2020-07-15: qty 2
  Filled 2020-07-15: qty 3

## 2020-07-15 MED ORDER — OXYCODONE HCL 5 MG PO TABS
5.0000 mg | ORAL_TABLET | ORAL | Status: DC | PRN
  Administered 2020-07-17 – 2020-07-21 (×3): 10 mg via ORAL
  Filled 2020-07-15 (×5): qty 2

## 2020-07-15 MED ORDER — DOCUSATE SODIUM 100 MG PO CAPS
100.0000 mg | ORAL_CAPSULE | Freq: Two times a day (BID) | ORAL | Status: DC
  Administered 2020-07-15 – 2020-07-22 (×13): 100 mg via ORAL
  Filled 2020-07-15 (×13): qty 1

## 2020-07-15 NOTE — H&P (Signed)
Monica Serrano is an 70 y.o. female.   Chief Complaint: Right hip wound with wound dehiscence HPI:   The patient is a 70 year old female well-known to me.  She actually underwent a primary total hip arthroplasty on March 21, 2020.  Postoperatively she did have some breakdown of her incision.  She is someone who originally had a BMI of over 40 and does have a large thigh.  She developed some adipose tissue necrosis and we treated this with local wound care.  She then developed worsening pain in the groin with weightbearing but no fever or chills or evidence of infection.  X-ray showed a change in position of the acetabular component.  I took her back to the operating room on 05/27/2020 and performed a revision of the acetabular component and was able to get her hip incision closed.  She then presented the office in follow-up feeling much better overall but once again developed some breakdown of her incision.  She was recovering in a skilled nursing center after surgery and eventually needed to have a treatment of the wound center.  She has had a VAC placed and then over the weekend felt some fever and chills and increased pain.  They remove the VAC and obtained a culture of that wound showing gram-positive cocci in pairs.  She is admitted to an outside hospital and started on IV antibiotics.  Her CRP was elevated at 13.  She is now transferred here for continued definitive wound care treatment since I am the original treating physician for her.  We did feel it was appropriate to have this transfer.  At the bedside today she looks good overall.  I looked at the wound closely and surprisingly there is no evidence of induration or cellulitis.  I was able to probe the wound and does not track deep at all.  There is no purulent drainage and actually really good granulation tissue.  Obviously wound cultures were taken from around the hip wound like this without cleaning the tissue while there could be some contamination  of her tissue.  However, we are still in to treat her as if there is a significant infection with his hip for now until it is indicated otherwise to not.  I am concerned about her pain though with weightbearing.  Past Medical History:  Diagnosis Date  . Anxiety   . CKD (chronic kidney disease)   . Depression   . DVT, lower extremity (HCC) 2019   right leg  . 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 Headache    Migraines - years ago  . History of kidney stones    passed  . Hyperlipemia   . Hypertension   . Lymphedema   . Osteoarthritis   . Sleep apnea   . Vertigo     Past Surgical History:  Procedure Laterality Date  . ANTERIOR HIP REVISION Right 05/27/2020   Procedure: REVISION ACETABULAR COMPONENT RIGHT HIP PLACEMENT OF WOUND VAC;  Surgeon: Kathryne Hitch, MD;  Location: [facility] OR;  Service: Orthopedics;  Laterality: Right;  . APPENDECTOMY    . BREAST SURGERY Right    Biospy  . EYE SURGERY Bilateral    Catartact  . JOINT REPLACEMENT     knees  . TOTAL HIP ARTHROPLASTY Right 03/21/2020   Procedure: RIGHT TOTAL HIP ARTHROPLASTY ANTERIOR APPROACH;  Surgeon: Kathryne Hitch, MD;  Location: [facility] OR;  Service: Orthopedics;  Laterality: Right;    No family history on file. Social History:  reports that she quit smoking about 34 years ago. Her smoking use included cigarettes. She quit after 10.00 years of use. She has never used smokeless tobacco. She reports previous alcohol use. She reports that she does not use drugs.  Allergies: No Known Allergies  Medications Prior to Admission  Medication Sig Dispense Refill  . acetaminophen (TYLENOL) 500 MG tablet Take 1,000 mg by mouth every 8 (eight) hours as needed for moderate pain or headache.    Marland Kitchen aspirin 81 MG chewable tablet Chew 1 tablet (81 mg total) by mouth 2 (two) times daily. 20 tablet 0  . atorvastatin (LIPITOR) 20 MG tablet Take 20 mg by mouth daily.    . candesartan (ATACAND) 16 MG tablet Take 16 mg  by mouth daily.    . cholecalciferol (VITAMIN D3) 25 MCG (1000 UNIT) tablet Take 2,000 Units by mouth daily.    . ciprofloxacin (CIPRO) 500 MG tablet Take 500 mg by mouth 2 (two) times daily. 5 day supply    . docusate sodium (COLACE) 100 MG capsule Take 100 mg by mouth daily.    Marland Kitchen doxycycline (VIBRA-TABS) 100 MG tablet Take 1 tablet (100 mg total) by mouth 2 (two) times daily. 30 tablet 0  . DULoxetine (CYMBALTA) 60 MG capsule Take 60 mg by mouth daily.    . fluconazole (DIFLUCAN) 100 MG tablet Take 1 tablet (100 mg total) by mouth daily. 1 tablet 0  . furosemide (LASIX) 40 MG tablet Take 40 mg by mouth daily.    Marland Kitchen gabapentin (NEURONTIN) 300 MG capsule Take 300 mg by mouth 2 (two) times daily.    . lansoprazole (PREVACID) 30 MG capsule Take 30 mg by mouth 2 (two) times daily.    Marland Kitchen nystatin (MYCOSTATIN/NYSTOP) powder Apply topically 2 (two) times daily.    . ondansetron (ZOFRAN ODT) 4 MG disintegrating tablet Take 1 tablet (4 mg total) by mouth every 8 (eight) hours as needed for nausea or vomiting. 20 tablet 0  . oxyCODONE (OXY IR/ROXICODONE) 5 MG immediate release tablet Take 1-2 tablets (5-10 mg total) by mouth every 6 (six) hours as needed for moderate pain (pain score 4-6). 30 tablet 0  . oxyCODONE-acetaminophen (PERCOCET) 7.5-325 MG tablet Take 1 tablet by mouth every 8 (eight) hours as needed for pain.    Marland Kitchen rOPINIRole (REQUIP) 1 MG tablet Take 1 mg by mouth at bedtime.    Marland Kitchen spironolactone (ALDACTONE) 25 MG tablet Take 25 mg by mouth daily.      No results found for this or any previous visit (from the past 48 hour(s)). No results found.  Review of Systems  All other systems reviewed and are negative.   There were no vitals taken for this visit. Physical Exam Vitals reviewed.  Constitutional:      Appearance: Normal appearance.  HENT:     Head: Normocephalic and atraumatic.  Eyes:     Extraocular Movements: Extraocular movements intact.     Pupils: Pupils are equal, round, and  reactive to light.  Cardiovascular:     Rate and Rhythm: Normal rate and regular rhythm.     Pulses: Normal pulses.  Pulmonary:     Effort: Pulmonary effort is normal.     Breath sounds: Normal breath sounds.  Abdominal:     Palpations: Abdomen is soft.  Musculoskeletal:     Cervical back: Normal range of motion.     Right hip: Bony tenderness present. Decreased strength.       Legs:  Neurological:  Mental Status: She is alert and oriented to person, place, and time.  Psychiatric:        Behavior: Behavior normal.      Assessment/Plan Right hip wound dehiscence and questionable infection  I will continue the ceftriaxone that she was put on recently at the outlying hospital.  I would like to obtain new plain films of her pelvis and hip.  I only had a CT scan report to review that stated there was no discernible fluid in the deep tissues around the right hip replacement.  We will obtain a new sed rate, CRP and CBC as well as BMET today.  I did pack in the wound with Xeroform and a new dry dressing.  I am also going to have the physical therapy wound service perform hydrotherapy on the wound tomorrow and Thursday.  We may consider returning to the operating room this Friday but this will depend on what her x-rays look like.  Right now I do not see a need to obtain a hip aspiration as I would not want to introduce anything into the hip and given the fact that she has been on IV antibiotics for several days now, it would make obtaining a good culture difficulty at this point.  Kathryne Hitch, MD 07/15/2020, 3:18 PM

## 2020-07-16 NOTE — Progress Notes (Signed)
Subjective: No nausea, chest pain or SOB.  Reports ambulating to bathroom on her own. Stil having pubis and anterior thigh pain with movement. Denies groin pain.    Objective: Vital signs in last 24 hours: Temp:  [97.9 F (36.6 C)-98.6 F (37 C)] 98.6 F (37 C) (02/09 0539) Pulse Rate:  [80-87] 87 (02/09 0539) Resp:  [16-17] 17 (02/09 0539) BP: (115-140)/(57-68) 140/68 (02/09 0539) SpO2:  [96 %-100 %] 100 % (02/09 0539) Weight:  [102.5 kg] 102.5 kg (02/08 1430)  Intake/Output from previous day: 02/08 0701 - 02/09 0700 In: 1890 [P.O.:840; I.V.:950; IV Piggyback:100] Out: 350 [Urine:350] Intake/Output this shift: No intake/output data recorded.  Recent Labs    07/15/20 1617  HGB 8.7*   Recent Labs    07/15/20 1617  WBC 9.2  RBC 3.09*  HCT 28.0*  PLT 375   Recent Labs    07/15/20 1617  NA 137  K 3.8  CL 106  CO2 23  BUN 6*  CREATININE 0.85  GLUCOSE 92  CALCIUM 9.5   No results for input(s): LABPT, INR in the last 72 hours.  Right lower extremity: Compartment soft Incision no malodor or purulent drainage. Moderate serosanguinous drainage on 4x4. No erythema about hip incision.    Assessment/Plan: Afebrile, WBC 9.2.  Continue Ceftriaxone.  Hydrotherapy to right hip wound.  Radiographs of right hip and pelvis yesterday showed no complicating features or changes from prior radiographs.    Monica Serrano 07/16/2020, 8:42 AM

## 2020-07-16 NOTE — Progress Notes (Signed)
   07/16/20 1400  Hydrotherapy evaluation.  Subjective Assessment  Subjective i want this to close up  Patient and Family Stated Goals be over this  wound  Date of Onset  (present for some time)  Prior Treatments VAC, wound center  Evaluation and Treatment  Evaluation and Treatment Procedures Explained to Patient/Family Yes  Evaluation and Treatment Procedures agreed to  Wound / Incision (Open or Dehisced) 07/16/20 Incision - Dehisced Groin Right small open wound on incision line- PT ONLY  Date First Assessed/Time First Assessed: 07/16/20 0930   Wound Type: Incision - Dehisced  Location: Groin  Location Orientation: Right  Wound Description (Comments): small open wound on incision line- PT ONLY  Dressing Type Moist to dry  Dressing Changed New  Dressing Status Clean;Dry  Dressing Change Frequency Daily  Site / Wound Assessment Clean;Pink  % Wound base Red or Granulating 100%  Peri-wound Assessment Intact  Wound Length (cm) 3 cm  Wound Width (cm) 2.5 cm  Wound Depth (cm) 1 cm  Wound Volume (cm^3) 7.5 cm^3  Wound Surface Area (cm^2) 7.5 cm^2  Tunneling (cm) 5 (6:00)  Undermining (cm)  (2 at 2:00, 1.5 2 3:00)  Margins Epibole (rolled edges)  Drainage Amount Minimal  Drainage Description Serosanguineous  Non-staged Wound Description Not applicable  Treatment Hydrotherapy (Pulse lavage);Packing (Saline gauze)  Hydrotherapy  Pulsed Lavage with Suction (psi) 4 psi  Pulsed Lavage with Suction - Normal Saline Used 1000 mL  Pulsed Lavage Tip Tunneling tip  Pulsed lavage therapy - wound location anterior thigh/ groin right  Wound Therapy - Assess/Plan/Recommendations  Wound Therapy - Clinical Statement Patient has an open area of incision  post direct anterior  THA several months ago.Has  been followed by wound center, has had wound VAC. Admitted now for possible closure.  Wound Therapy - Functional Problem List none, ambulatory with RW  Hydrotherapy Plan Patient/family  education;Pulsatile lavage with suction  Wound Therapy - Frequency  (per order , to perform  Wed.and Thurs.)  Wound Therapy - Current Recommendations PT  Wound Therapy - Follow Up Recommendations  (per MD)  Wound Plan PLS 1 more time  Wound Therapy Goals - Improve the function of patient's integumentary system by progressing the wound(s) through the phases of wound healing by:  Improve Drainage Characteristics Min  Improve Drainage Characteristics - Progress Goal set today  Goals/treatment plan/discharge plan were made with and agreed upon by patient/family Yes  Time For Goal Achievement 2 days  Wound Therapy - Potential for Goals Good  Blanchard Kelch PT Acute Rehabilitation Services Pager [phone] Office [phone]

## 2020-07-17 ENCOUNTER — Inpatient Hospital Stay (HOSPITAL_COMMUNITY): Payer: Medicare Other

## 2020-07-17 ENCOUNTER — Inpatient Hospital Stay: Payer: Self-pay

## 2020-07-17 LAB — CBC
HCT: 28 % — ABNORMAL LOW (ref 36.0–46.0)
Hemoglobin: 8.6 g/dL — ABNORMAL LOW (ref 12.0–15.0)
MCH: 28.3 pg (ref 26.0–34.0)
MCHC: 30.7 g/dL (ref 30.0–36.0)
MCV: 92.1 fL (ref 80.0–100.0)
Platelets: 426 10*3/uL — ABNORMAL HIGH (ref 150–400)
RBC: 3.04 MIL/uL — ABNORMAL LOW (ref 3.87–5.11)
RDW: 15.6 % — ABNORMAL HIGH (ref 11.5–15.5)
WBC: 9.6 10*3/uL (ref 4.0–10.5)
nRBC: 0 % (ref 0.0–0.2)

## 2020-07-17 LAB — C-REACTIVE PROTEIN: CRP: 8.1 mg/dL — ABNORMAL HIGH (ref <1.0)

## 2020-07-17 IMAGING — RF DG FLUORO GUIDE NDL PLC/BX
1 series · 1 of 1 positions shown · non-contrast
Comparison: none

CLINICAL DATA: Previous hip arthroplasty. Infected anterior wound.
Small effusion described on CT. Aspiration requested

EXAM:
RIGHT HIP ASPIRATION  UNDER FLUOROSCOPY
FLUOROSCOPY TIME:  0.4 MINUTE; 3 MGY
TECHNIQUE: The procedure, risks (including but not limited to bleeding,
infection, organ damage ), benefits, and alternatives were explained
to the patient. Questions regarding the procedure were encouraged
and answered. The patient understands and consents to the procedure.

[Series 1: cp_standard · 0.25mm/px · 1 of 1 slices shown]
[im 1/1]
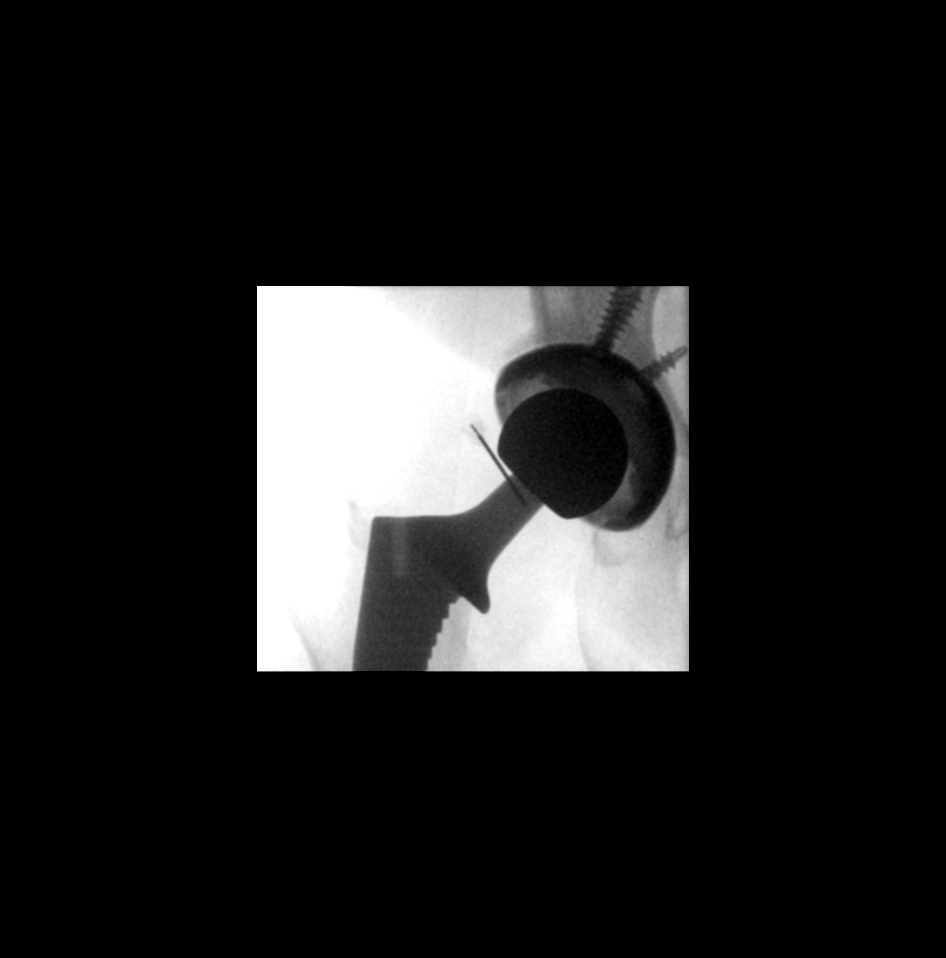

[1 of 1 positions shown; findings below may reference images not displayed]

An appropriate skin entry site was determined under fluoroscopy.
Site was marked, prepped with chlorhexidine, draped in usual sterile
fashion, infiltrated locally with 1% lidocaine. An 18 gauge spinal
needle was advanced to the neck of the femoral component of hip
arthroplasty. No fluid returned on aspiration. Lavage with 1.5 mL
nonbacteriostatic saline returned a scant amount of serosanguineous
fluid, sent for the requested laboratory studies. The patient
tolerated procedure well.

COMPLICATIONS:
None immediate
IMPRESSION: 1. Technically successful right hip aspiration under fluoroscopy

## 2020-07-17 MED ORDER — SODIUM CHLORIDE 0.9% FLUSH: 10.0000 mL | INTRAVENOUS | Status: DC | PRN

## 2020-07-17 MED ORDER — CHLORHEXIDINE GLUCONATE CLOTH 2 % EX PADS
6.0000 | MEDICATED_PAD | Freq: Every day | CUTANEOUS | Status: DC
  Administered 2020-07-17 – 2020-07-22 (×6): 6 via TOPICAL

## 2020-07-17 MED ORDER — SODIUM CHLORIDE 0.9% FLUSH
10.0000 mL | Freq: Two times a day (BID) | INTRAVENOUS | Status: DC
  Administered 2020-07-18: 10 mL

## 2020-07-17 MED ORDER — LIDOCAINE HCL 1 % IJ SOLN
INTRAMUSCULAR | Status: AC
  Administered 2020-07-17: 15 mL via INTRA_ARTICULAR
  Filled 2020-07-17: qty 20

## 2020-07-17 NOTE — Progress Notes (Signed)
PT Cancellation Note  Patient Details Name: Monica Serrano MRN: [id number] DOB: 20-Mar-1951   Cancelled Treatment:    Reason Eval/Treat Not Completed: PT screened, no needs identified, will sign off, patient is up ad lib  In room, using RW. She reports  Doing well.   Rada Hay 07/17/2020, 4:17 PM Blanchard Kelch PT Acute Rehabilitation Services Pager [phone] Office [phone]

## 2020-07-17 NOTE — Procedures (Signed)
  Procedure: R hip aspiration under fluoro <1 ml thin serosanguinous, sent for requested labs EBL:   minimal Complications:  none immediate  See full dictation in [employer].  Thora Lance MD Main # [phone] Pager  [phone] Mobile [phone]

## 2020-07-17 NOTE — Progress Notes (Signed)
Subjective:    Patient reports pain as mild to moderate. Had hydrotherapy to right hip earlier this AM. Discussed with patient planned interventional radiology aspiration of right hip joint. She notes she has a rough area under heer dentures but no known infection states her dentist tells her to gargle with salt water. Also has an area over her right shoulder that has bee present and draining since October. Drainage is described as clear drainage  .  Began after using Bio-Freeze on her shoulder.   Objective: Vital signs in last 24 hours: Temp:  [98.4 F (36.9 C)-99 F (37.2 C)] 99 F (37.2 C) (02/10 0614) Pulse Rate:  [72-86] 72 (02/10 0614) Resp:  [16-17] 17 (02/10 0614) BP: (118-139)/(60-74) 123/61 (02/10 0614) SpO2:  [96 %-100 %] 100 % (02/10 0614)  Intake/Output from previous day: 02/09 0701 - 02/10 0700 In: 1058.2 [P.O.:780; I.V.:178.2; IV Piggyback:100] Out: 500 [Urine:500] Intake/Output this shift: Total I/O In: 400 [P.O.:400] Out: -   Recent Labs    07/15/20 1617 07/17/20 0330  HGB 8.7* 8.6*   Recent Labs    07/15/20 1617 07/17/20 0330  WBC 9.2 9.6  RBC 3.09* 3.04*  HCT 28.0* 28.0*  PLT 375 426*   Recent Labs    07/15/20 1617  NA 137  K 3.8  CL 106  CO2 23  BUN 6*  CREATININE 0.85  GLUCOSE 92  CALCIUM 9.5   No results for input(s): LABPT, INR in the last 72 hours.  Right lower extremity: Dorsiflexion/Plantar flexion intact Incision: dressing C/D/I Compartment soft  Right shoulder: Small nummular area over the anterior acromion about the size of a ballpoint pen point.  Serous sanguinous drainage scant.  No purulence.  No other erythema ecchymosis or areas of skin rash or abrasion seen over the shoulder girdles.   Assessment/Plan:   Continue antibiotics. Right hip fluoroscopic guided aspiration to be sent for cell count, Gram stain and culture. Hydrotherapy right hip Will keep patient through the weekend for IV antibiotics and awaiting cultures  from hip aspirate. Long discussion was had with patient today about interventional radiology and her current treatment for the right hip wound.      GILBERT CLARK 07/17/2020, 10:09 AM

## 2020-07-17 NOTE — Progress Notes (Signed)
Peripherally Inserted Central Catheter Placement  The IV Nurse has discussed with the patient and/or persons authorized to consent for the patient, the purpose of this procedure and the potential benefits and risks involved with this procedure.  The benefits include less needle sticks, lab draws from the catheter, and the patient may be discharged home with the catheter. Risks include, but not limited to, infection, bleeding, blood clot (thrombus formation), and puncture of an artery; nerve damage and irregular heartbeat and possibility to perform a PICC exchange if needed/ordered by physician.  Alternatives to this procedure were also discussed.  Bard Power PICC patient education guide, fact sheet on infection prevention and patient information card has been provided to patient /or left at bedside.    PICC Placement Documentation  PICC Single Lumen 07/17/20 PICC Right Brachial 40 cm 0 cm (Active)  Indication for Insertion or Continuance of Line Home intravenous therapies (PICC only) 07/17/20 1835  Exposed Catheter (cm) 0 cm 07/17/20 1835  Site Assessment Clean;Dry;Intact 07/17/20 1835  Line Status Flushed;Saline locked;Blood return noted 07/17/20 1835  Dressing Type Transparent 07/17/20 1835  Dressing Status Clean;Dry;Intact 07/17/20 1835  Antimicrobial disc in place? Yes 07/17/20 1835  Dressing Intervention New dressing 07/17/20 1835  Dressing Change Due 07/24/20 07/17/20 1835       Ethelda Chick 07/17/2020, 6:36 PM

## 2020-07-17 NOTE — Progress Notes (Addendum)
   07/17/20 1200 Hydrotherapy NOTE-  Subjective Assessment  Subjective i want this to close up  Patient and Family Stated Goals be over this  wound  Prior Treatments VAC, wound center  Evaluation and Treatment  Evaluation and Treatment Procedures Explained to Patient/Family Yes  Evaluation and Treatment Procedures agreed to  Wound / Incision (Open or Dehisced) 07/16/20 Incision - Dehisced Groin Right small open wound on incision line- PT ONLY  Date First Assessed/Time First Assessed: 07/16/20 0930   Wound Type: Incision - Dehisced  Location: Groin  Location Orientation: Right  Wound Description (Comments): small open wound on incision line- PT ONLY  Dressing Type Moist to dry  Dressing Changed New  Dressing Status Clean  Dressing Change Frequency Daily  Site / Wound Assessment Pink  % Wound base Red or Granulating 100%  Peri-wound Assessment Intact  Wound Length (cm) 3 cm  Wound Width (cm) 2.5 cm  Wound Depth (cm) 1 cm  Wound Volume (cm^3) 7.5 cm^3  Wound Surface Area (cm^2) 7.5 cm^2  Tunneling (cm) 5  Margins Epibole (rolled edges)  Drainage Amount Minimal  Drainage Description Serosanguineous  Non-staged Wound Description Not applicable  Treatment Hydrotherapy (Pulse lavage);Packing (Saline gauze)  Hydrotherapy  Pulsed Lavage with Suction (psi) 4 psi  Pulsed Lavage with Suction - Normal Saline Used 1000 mL  Pulsed Lavage Tip Tunneling tip  Pulsed lavage therapy - wound location anterior thigh/ groin right  Wound Therapy - Assess/Plan/Recommendations  Wound Therapy - Clinical Statement Wound is clean with pink in bed of wound. PT will sign off as ordered for 2 visits per Dr. Magnus Ivan. Will need new orders to continue Hydrotherapy.  Wound Therapy - Functional Problem List none, ambulatory with RW  Hydrotherapy Plan Patient/family education;Pulsatile lavage with suction  Wound Therapy - Current Recommendations  (per MD)  Wound Plan DC hydro- will need reorder to continue..   Wound Therapy Goals - Improve the function of patient's integumentary system by progressing the wound(s) through the phases of wound healing by:  Improve Drainage Characteristics Min  Improve Drainage Characteristics - Progress Progressing toward goal  Goals/treatment plan/discharge plan were made with and agreed upon by patient/family Yes  Blanchard Kelch PT Acute Rehabilitation Services Pager [phone] Office [phone]

## 2020-07-18 DIAGNOSIS — I890 Lymphedema, not elsewhere classified: Secondary | ICD-10-CM | POA: Diagnosis not present

## 2020-07-18 DIAGNOSIS — T8130XA Disruption of wound, unspecified, initial encounter: Secondary | ICD-10-CM

## 2020-07-18 DIAGNOSIS — T8149XA Infection following a procedure, other surgical site, initial encounter: Secondary | ICD-10-CM | POA: Diagnosis not present

## 2020-07-18 MED ORDER — ROPINIROLE HCL 1 MG PO TABS
1.0000 mg | ORAL_TABLET | Freq: Every day | ORAL | Status: DC
  Administered 2020-07-18 – 2020-07-21 (×4): 1 mg via ORAL
  Filled 2020-07-18 (×4): qty 1

## 2020-07-18 MED ORDER — CALCIUM CARBONATE ANTACID 500 MG PO CHEW
2.0000 | CHEWABLE_TABLET | Freq: Three times a day (TID) | ORAL | Status: DC | PRN
  Administered 2020-07-18 – 2020-07-20 (×3): 400 mg via ORAL
  Filled 2020-07-18 (×3): qty 2

## 2020-07-18 NOTE — Care Management Important Message (Signed)
Medicare IM printed for Nicole Sinclair, LCSW to give to the patient. 

## 2020-07-18 NOTE — Consult Note (Signed)
[facility] for Infectious Disease    Date of Admission:  07/15/2020   Total days of antibiotics: 5 ceftriaxone               Reason for Consult: Wound infection, dehiscense    Referring Provider: Ninfa Linden   Assessment: Wound dehiscence R THR 03-21-2020 Obesity (BMI > 40) R shoulder wound Lymphedema  Plan: 1. Await her aspirate Cx 2. Continue ceftriaxone 3. She has a PIC LUE 4. outpt derm eval for R shoulder wound  Comment- By her description she had a swab Cx done of her hip wound. This is very hard to tell if she has infecting organisms on culture or if she has colonization.  Hopefully her aspirate will be helpful (although done while/after antibiotics).   Her R shoulder wound is superficial, non-healing since October. DOes not appear heaped up like squamous cell, basal cell. Would be worth having derm f/u.   Thank you so much for this interesting consult,  Principal Problem:   Wound dehiscence right hip   . Chlorhexidine Gluconate Cloth  6 each Topical Daily  . docusate sodium  100 mg Oral BID  . sodium chloride flush  10-40 mL Intracatheter Q12H    HPI: Monica Serrano is a 70 y.o. female with hx of R THR 49-70-26 which was complicated by adipose necrosis, and wound breakdown. She was noted to have loosening of the acetabular component as well and returned to OR on 05-27-20.  She had VAC placement. This week she was seen at her [facility] and noted worsening odor of her wound. Overnight she had worsening pain and went to [facility]. Her CRP was 13, she had a wound swab which grew Strep agalacticae (R- Clinda, I-levaquin) and Proteus mirabilis (pan-sens), and she was started on ceftriaxone. Her CT (2-5) showed: 1. No fracture or dislocation.  2. Right hip prosthesis in place without evidence of loosening or bony destruction.  3. Inflamed soft tissues anteriorly with skin wound anteriorly and laterally to the right hip.  4. Suspect small jointeffusion.  5.  Moderate osteoarthrosis left hip.     She was then transferred to [facility] on 2-8. Her CRP was 11.5.ESR 75. She has been receiving hydrotherapy and ceftriaxone here. She had aspirate of her hip done 2-10 has been no growth at < 24h.  She has been afebrile and her WBC has been normal.   Review of Systems: Review of Systems  Constitutional: Positive for chills. Negative for fever.  Respiratory: Negative for cough and shortness of breath.   Gastrointestinal: Positive for constipation. Negative for diarrhea.  Genitourinary: Negative for dysuria.    Past Medical History:  Diagnosis Date  . Anxiety   . CKD (chronic kidney disease)   . Depression   . DVT, lower extremity ([facility]) 2019   right leg  . 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 Headache    Migraines - years ago  . History of kidney stones    passed  . Hyperlipemia   . Hypertension   . Lymphedema   . Osteoarthritis   . Sleep apnea   . Vertigo     Social History   Tobacco Use  . Smoking status: Former Smoker    Years: 10.00    Types: Cigarettes    Quit date: 1988    Years since quitting: 34.1  . Smokeless tobacco: Never Used  Vaping Use  . Vaping Use: Never used  Substance Use Topics  .  Alcohol use: Not Currently  . Drug use: Never    History reviewed. No pertinent family history.   Medications:  Scheduled: . Chlorhexidine Gluconate Cloth  6 each Topical Daily  . docusate sodium  100 mg Oral BID  . sodium chloride flush  10-40 mL Intracatheter Q12H    Abtx:  Anti-infectives (From admission, onward)   Start     Dose/Rate Route Frequency Ordered Stop   07/15/20 1600  cefTRIAXone (ROCEPHIN) 2 g in sodium chloride 0.9 % 100 mL IVPB        2 g 200 mL/hr over 30 Minutes Intravenous Daily-1800 07/15/20 1527          OBJECTIVE: Blood pressure 140/65, pulse 73, temperature 98.7 F (37.1 C), temperature source Oral, resp. rate 18, height 5\' 5"  (1.651 m), weight 102.5 kg, SpO2 96 %.  Physical  Exam Vitals reviewed.  Constitutional:      Appearance: Normal appearance. She is obese.  HENT:     Mouth/Throat:     Mouth: Mucous membranes are moist.     Pharynx: No oropharyngeal exudate.  Eyes:     Extraocular Movements: Extraocular movements intact.     Pupils: Pupils are equal, round, and reactive to light.  Cardiovascular:     Rate and Rhythm: Normal rate and regular rhythm.  Pulmonary:     Effort: Pulmonary effort is normal.     Breath sounds: Normal breath sounds.  Abdominal:     General: Bowel sounds are normal. There is no distension.     Palpations: Abdomen is soft.     Tenderness: There is no abdominal tenderness.  Musculoskeletal:     Cervical back: Normal range of motion and neck supple.     Right lower leg: Edema present.     Left lower leg: Edema present.       Legs:  Neurological:     General: No focal deficit present.     Mental Status: She is alert.  Psychiatric:        Mood and Affect: Mood normal.     Lab Results Results for orders placed or performed during the hospital encounter of 07/15/20 (from the past 48 hour(s))  CBC     Status: Abnormal   Collection Time: 07/17/20  3:30 AM  Result Value Ref Range   WBC 9.6 4.0 - 10.5 K/uL   RBC 3.04 (L) 3.87 - 5.11 MIL/uL   Hemoglobin 8.6 (L) 12.0 - 15.0 g/dL   HCT 09/14/20 (L) 06.2 - 37.6 %   MCV 92.1 80.0 - 100.0 fL   MCH 28.3 26.0 - 34.0 pg   MCHC 30.7 30.0 - 36.0 g/dL   RDW 28.3 (H) 15.1 - 76.1 %   Platelets 426 (H) 150 - 400 K/uL   nRBC 0.0 0.0 - 0.2 %    Comment: Performed at [facility], [address] [place], [place]  C-reactive protein     Status: Abnormal   Collection Time: 07/17/20  3:30 AM  Result Value Ref Range   CRP 8.1 (H) <1.0 mg/dL    Comment: Performed at [facility] [address] [place], [place]  Body fluid culture     Status: None (Preliminary result)   Collection Time: 07/17/20 10:30 AM   Specimen: Joint, Hip;  Body Fluid  Result Value Ref Range   Specimen Description      HIP RIGHT Performed at [facility], [address] [place], [place]  Special Requests      Normal Performed at [facility], [place] [address], [place], [place] [address]    Gram Stain      FEW WBC PRESENT, PREDOMINANTLY MONONUCLEAR NO ORGANISMS SEEN    Culture      NO GROWTH < 24 HOURS Performed at [facility] [address], [place], [place] [address]    Report Status PENDING       Component Value Date/Time   SDES  07/17/2020 1030    HIP RIGHT Performed at [facility], [place] [address], [place], [place] [address]    SPECREQUEST  07/17/2020 1030    Normal Performed at [facility], [place] [address] [place], [place] [address]    CULT  07/17/2020 1030    NO GROWTH < 24 HOURS Performed at [facility] [address], [place], [place] [address]    REPTSTATUS PENDING 07/17/2020 1030   DG FLUORO GUIDED NEEDLE PLC ASPIRATION/INJECTION LOC  Result Date: 07/17/2020 CLINICAL DATA:  Previous hip arthroplasty. Infected anterior wound. Small effusion described on CT. Aspiration requested EXAM: RIGHT HIP ASPIRATION  UNDER FLUOROSCOPY FLUOROSCOPY TIME:  0.4 MINUTE; 3 MGY TECHNIQUE: The procedure, risks (including but not limited to bleeding, infection, organ damage ), benefits, and alternatives were explained to the patient. Questions regarding the procedure were encouraged and answered. The patient understands and consents to the procedure. An appropriate skin entry site was determined under fluoroscopy. Site was marked, prepped with chlorhexidine, draped in usual sterile fashion, infiltrated locally with 1% lidocaine. An 18 gauge spinal needle was advanced to the neck of the femoral component of hip arthroplasty. No fluid returned on aspiration. Lavage with 1.5 mL nonbacteriostatic saline returned a scant amount of serosanguineous  fluid, sent for the requested laboratory studies. The patient tolerated procedure well. COMPLICATIONS: None immediate IMPRESSION: 1. Technically successful right hip aspiration under fluoroscopy Electronically Signed   By: Lucrezia Europe M.D.   On: 07/17/2020 16:44   [place] EKG SITE RITE  Result Date: 07/17/2020 If Site Rite image not attached, placement could not be confirmed due to current cardiac rhythm.  Recent Results (from the past 240 hour(s))  Body fluid culture     Status: None (Preliminary result)   Collection Time: 07/17/20 10:30 AM   Specimen: Joint, Hip; Body Fluid  Result Value Ref Range Status   Specimen Description   Final    HIP RIGHT Performed at [facility] [address], [place], [place] [address]    Special Requests   Final    Normal Performed at [facility], [place] [address] [place], [place] [address]    Gram Stain   Final    FEW WBC PRESENT, PREDOMINANTLY MONONUCLEAR NO ORGANISMS SEEN    Culture   Final    NO GROWTH < 24 HOURS Performed at [facility] [address], [place], [place] [address]    Report Status PENDING  Incomplete    Microbiology: Recent Results (from the past 240 hour(s))  Body fluid culture     Status: None (Preliminary result)   Collection Time: 07/17/20 10:30 AM   Specimen: Joint, Hip; Body Fluid  Result Value Ref Range Status   Specimen Description   Final    HIP RIGHT Performed at [facility] [address] [place], [place] [address]    Special Requests   Final    Normal Performed at [facility],  [place] [address], [place], [place] [address]    Gram Stain   Final    FEW WBC PRESENT, PREDOMINANTLY MONONUCLEAR NO ORGANISMS SEEN    Culture   Final    NO GROWTH < 24 HOURS Performed at [facility] [address], [place], [place] [address]    Report Status PENDING  Incomplete    Radiographs and labs were personally reviewed by me.    Bobby Rumpf, MD [facility] [phone] 07/18/2020, 2:42 PM

## 2020-07-18 NOTE — Progress Notes (Signed)
Patient Details Name: Monica Serrano MRN: [id number] DOB: 04-20-1951   07/18/20  Hydrotherapy Treatment Note  Subjective Assessment  Subjective "You all make a great team"  Patient and Family Stated Goals healing  Prior Treatments VAC, wound center  Evaluation and Treatment  Evaluation and Treatment Procedures Explained to Patient/Family Yes  Evaluation and Treatment Procedures agreed to  Wound / Incision (Open or Dehisced) 07/16/20 Incision - Dehisced Groin Right small open wound on incision line- PT ONLY  Date First Assessed/Time First Assessed: 07/16/20 0930   Wound Type: Incision - Dehisced  Location: Groin  Location Orientation: Right  Wound Description (Comments): small open wound on incision line- PT ONLY  Dressing Type Moist to dry  Dressing Changed New  Dressing Status Clean  Dressing Change Frequency Daily  Site / Wound Assessment Pink  % Wound base Red or Granulating 100%  Peri-wound Assessment Intact  Wound Length (cm) 3 cm  Wound Width (cm) 2.5 cm  Wound Depth (cm) 1 cm  Wound Volume (cm^3) 7.5 cm^3  Wound Surface Area (cm^2) 7.5 cm^2  Tunneling (cm) 5  Margins Epibole (rolled edges)  Drainage Amount Minimal  Drainage Description Serosanguineous  Non-staged Wound Description Not applicable  Treatment Hydrotherapy (Pulse lavage);Packing (Saline gauze), ABD cover with paper tape (skin prep prior to tape)  Hydrotherapy  Pulsed Lavage with Suction (psi) 4 psi  Pulsed Lavage with Suction - Normal Saline Used 1000 mL  Pulsed Lavage Tip Tunneling tip  Pulsed lavage therapy - wound location anterior thigh/ groin right  Wound Therapy - Assess/Plan/Recommendations  Wound Therapy - Clinical Statement Dr. Ninfa Linden requested 3rd hydrotherapy session today and plans to come by this evening to apply NPWT.  No further hydrotherapy indicated thereafter, so PT to sign off.  Wound Therapy - Functional Problem List none, ambulatory with RW  Hydrotherapy Plan Patient/family  education;Pulsatile lavage with suction  Wound Therapy - Current Recommendations  (per MD)  Wound Plan DC hydro- will need reorder to continue..  Wound Therapy Goals - Improve the function of patient's integumentary system by progressing the wound(s) through the phases of wound healing by:  Improve Drainage Characteristics Min  Improve Drainage Characteristics - Progress Goals met  Goals/treatment plan/discharge plan were made with and agreed upon by patient/family Yes    Time: 2811-8867  Jannette Spanner PT, DPT Acute Rehabilitation Services Pager: [phone] Office: [phone] [place] Ram E 07/18/2020, 2:03 PM

## 2020-07-18 NOTE — Progress Notes (Signed)
Patient ID: Monica Serrano, female   DOB: [birth year], 70 y.o.   MRN: [id number] The patient reports overall that she is doing well.  She is mobilizing well in her room.  She is deconditioned, but does report that there is no pain right now with weightbearing and her right hip.  She had 3 good days of hydrotherapy on her small right hip wound.  I did place a VAC sponge in the wound this afternoon and set it to -125 mmhg pressure.  There is a good seal.  My plan is to leave this back on for 3 days and then to change it again at the bedside and hopefully send her home with a home VAC.  I certainly appreciate the Infectious Disease service of Dr. Ninetta Lights consulting on this patient as well.

## 2020-07-18 NOTE — Progress Notes (Signed)
Patient ID: Monica Serrano, female   DOB: 1950-06-08, 70 y.o.   MRN: [id number] The patient has had 3 days of hydrotherapy at the bedside biopsy PT wound service.  This is on her right hip wound.  It has been granulating nicely.  On my exam earlier this week it did not track deep at all.  An aspiration of her right hip yesterday by radiology showed minimal fluid.  Thus far the cultures from their negative and there was minimal white blood cells.  There was not enough fluid to send for cell count.  She has continued on IV Rocephin.  This is per the recommendation from infectious disease that originally saw her at the outlying hospital.  I will consult the infectious disease service here for their insights and recommendations as well as duration of antibiotics.  Her culture did grow out strep agala from a swab done in the outlying hospital.  Her labs are trending in the right direction and she is mobilizing very well with minimal discomfort at this point.  A CT scan done at the outlying hospital of her right hip did not show any significant fluid around the prosthesis.  Late this afternoon a plan to come by and place a VAC sponge in her wound and let that help the wound granulate further over the next few days.

## 2020-07-19 MED ORDER — PANTOPRAZOLE SODIUM 40 MG PO TBEC: 40.0000 mg | DELAYED_RELEASE_TABLET | Freq: Two times a day (BID) | ORAL | Status: DC

## 2020-07-19 MED ORDER — ONDANSETRON HCL 4 MG/2ML IJ SOLN
4.0000 mg | Freq: Four times a day (QID) | INTRAMUSCULAR | Status: DC | PRN
  Administered 2020-07-20: 4 mg via INTRAVENOUS
  Filled 2020-07-19: qty 2

## 2020-07-19 MED ORDER — PANTOPRAZOLE SODIUM 40 MG PO TBEC
40.0000 mg | DELAYED_RELEASE_TABLET | Freq: Two times a day (BID) | ORAL | Status: DC
  Administered 2020-07-19 – 2020-07-22 (×6): 40 mg via ORAL
  Filled 2020-07-19 (×6): qty 1

## 2020-07-19 NOTE — Progress Notes (Signed)
Patient complaints of reflux and  Stated she hasnt been on her prevacid since admission. Dr Lajoyce Corners paged and orders received D Susann Givens RN

## 2020-07-19 NOTE — Progress Notes (Signed)
Patient ID: Monica Serrano, female   DOB: 09-25-1950, 70 y.o.   MRN: [id number] The patient reports that she did have a problem with restless legs last evening.  She is otherwise sitting in a chair comfortably this morning.  I did place a VAC on her small right hip wound yesterday.  My plan is to leave that on for the weekend and to change at the bedside Monday.  Family is going to bring her home VAC machine to the bedside when we switch that out on Monday.  If things continue to improve, anticipate discharging her to home with VAC changes every 3 to 4 days on that wound.  She does have a PICC line.  I appreciate the infectious disease service and they will consult on this patient of mine.  She is deconditioned, I have reordered physical therapy to see if they can help mobilize her further than just in the room prior to her discharge.  The right hip aspiration cultures thus far negative.  I feel that her infection was more the superficial and deep tissues but not down to the hip replacement.  I have probed her right hip wound and it is not run deep at all and there was no sinus tracts.

## 2020-07-19 NOTE — Evaluation (Signed)
Physical Therapy Evaluation Patient Details Name: Monica Serrano MRN: [id number] DOB: 24-Mar-1951 Today's Date: 07/19/2020   History of Present Illness  Patient is 70 y/o female with PMH of CKD, DVT, lymphedema, HTN, OA, sleep apnea, and vertigo. Patient underwent R THA on 03/21/20. Patient s/p R THA revision on 05/27/20..  Ongoing dehiscence of incision, treated with wound VAC. Admitted 07/15/20 with cincerns for infection. Hydro therapy x 3 days, VAC placed 07/18/20..  Clinical Impression  The patient  pleased to ambulate in hall today x 420' using RW. Gait steady. Encouraged patient  To ask staff to accompany her for ambulation. Wound VAC in place.  Pt admitted with above diagnosis.   Pt currently with functional limitations due to the deficits listed below (see PT Problem List). Pt will benefit from skilled PT to increase their independence and safety with mobility to allow discharge to the venue listed below.       Follow Up Recommendations Home health PT (is active 2x wk)    Equipment Recommendations  None recommended by PT    Recommendations for Other Services       Precautions / Restrictions Precautions Precautions: Fall Restrictions Weight Bearing Restrictions: No      Mobility  Bed Mobility               General bed mobility comments: in recliner    Transfers Overall transfer level: Needs assistance Equipment used: Rolling walker (2 wheeled) Transfers: Sit to/from Stand Sit to Stand: Supervision            Ambulation/Gait Ambulation/Gait assistance: Supervision Gait Distance (Feet): 420 Feet Assistive device: Rolling walker (2 wheeled) Gait Pattern/deviations: Step-through pattern Gait velocity: decr   General Gait Details: gait is slow and steady  Careers information officer    Modified Rankin (Stroke Patients Only)       Balance Overall balance assessment: No apparent balance deficits (not formally assessed)                                            Pertinent Vitals/Pain      Home Living Family/patient expects to be discharged to:: Private residence Living Arrangements: Alone Available Help at Discharge: Family;Available PRN/intermittently Type of Home: House Home Access: Ramped entrance     Home Layout: One level Home Equipment: Walker - 2 wheels;Shower seat - built in;Hand held shower head;Grab bars - toilet;Bedside commode;Adaptive equipment      Prior Function Level of Independence: Independent with assistive device(s)         Comments: reports independent ADLs, mobility using RW (RW does not ft in bathroom)     Hand Dominance   Dominant Hand: Right    Extremity/Trunk Assessment   Upper Extremity Assessment Upper Extremity Assessment: Overall WFL for tasks assessed    Lower Extremity Assessment Lower Extremity Assessment: RLE deficits/detail;Overall WFL for tasks assessed RLE Deficits / Details: reports has a shoe lift,    Cervical / Trunk Assessment Cervical / Trunk Assessment: Normal  Communication   Communication: No difficulties  Cognition Arousal/Alertness: Awake/alert Behavior During Therapy: WFL for tasks assessed/performed Overall Cognitive Status: Within Functional Limits for tasks assessed  General Comments General comments (skin integrity, edema, etc.): VAC in place right groin    Exercises     Assessment/Plan    PT Assessment Patient needs continued PT services  PT Problem List Decreased strength;Decreased mobility;Decreased activity tolerance;Decreased skin integrity       PT Treatment Interventions DME instruction;Therapeutic activities;Gait training;Therapeutic exercise;Patient/family education;Functional mobility training    PT Goals (Current goals can be found in the Care Plan section)  Acute Rehab PT Goals Patient Stated Goal: to get this  wound healed PT Goal Formulation:  With patient/family Time For Goal Achievement: 08/02/20 Potential to Achieve Goals: Good    Frequency Min 3X/week   Barriers to discharge        Co-evaluation               AM-PAC PT "6 Clicks" Mobility  Outcome Measure Help needed turning from your back to your side while in a flat bed without using bedrails?: None Help needed moving from lying on your back to sitting on the side of a flat bed without using bedrails?: None Help needed moving to and from a bed to a chair (including a wheelchair)?: None Help needed standing up from a chair using your arms (e.g., wheelchair or bedside chair)?: None Help needed to walk in hospital room?: A Little Help needed climbing 3-5 steps with a railing? : A Little 6 Click Score: 22    End of Session   Activity Tolerance: Patient tolerated treatment well Patient left: in chair;with call bell/phone within reach;with family/visitor present Nurse Communication: Mobility status PT Visit Diagnosis: Difficulty in walking, not elsewhere classified (R26.2)    Time: 1856-3149 PT Time Calculation (min) (ACUTE ONLY): 17 min   Charges:   PT Evaluation $PT Eval Low Complexity: 1 Low          Blanchard Kelch PT Acute Rehabilitation Services Pager [phone] Office [phone]   Rada Hay 07/19/2020, 2:59 PM

## 2020-07-19 NOTE — Plan of Care (Signed)
  Problem: Clinical Measurements: Goal: Diagnostic test results will improve Outcome: Progressing   Problem: Clinical Measurements: Goal: Respiratory complications will improve Outcome: Progressing   Problem: Clinical Measurements: Goal: Cardiovascular complication will be avoided Outcome: Progressing   Problem: Coping: Goal: Level of anxiety will decrease Outcome: Progressing   Problem: Elimination: Goal: Will not experience complications related to bowel motility Outcome: Progressing   Problem: Safety: Goal: Ability to remain free from injury will improve Outcome: Progressing   Problem: Pain Managment: Goal: General experience of comfort will improve Outcome: Progressing

## 2020-07-20 LAB — CBC
HCT: 29.7 % — ABNORMAL LOW (ref 36.0–46.0)
Hemoglobin: 9 g/dL — ABNORMAL LOW (ref 12.0–15.0)
MCH: 27.8 pg (ref 26.0–34.0)
MCHC: 30.3 g/dL (ref 30.0–36.0)
MCV: 91.7 fL (ref 80.0–100.0)
Platelets: 516 10*3/uL — ABNORMAL HIGH (ref 150–400)
RBC: 3.24 MIL/uL — ABNORMAL LOW (ref 3.87–5.11)
RDW: 15.8 % — ABNORMAL HIGH (ref 11.5–15.5)
WBC: 7.8 10*3/uL (ref 4.0–10.5)
nRBC: 0 % (ref 0.0–0.2)

## 2020-07-20 LAB — C-REACTIVE PROTEIN: CRP: 1.5 mg/dL — ABNORMAL HIGH (ref <1.0)

## 2020-07-20 NOTE — Progress Notes (Signed)
Physical Therapy Treatment Patient Details Name: Monica Serrano MRN: [id number] DOB: Nov 17, 1950 Today's Date: 07/20/2020    History of Present Illness Patient is 70 y/o female with PMH of CKD, DVT, lymphedema, HTN, OA, sleep apnea, and vertigo. Patient underwent R THA on 03/21/20. Patient s/p R THA revision on 05/27/20..  Ongoing dehiscence of incision, treated with wound VAC. Admitted 07/15/20 with cincerns for infection. Hydro therapy x 3 days, VAC placed 07/18/20.Marland Kitchen    PT Comments    Pt in good spirits and mobilizing well including up to bathroom for toileting and hygiene standing at sink - pt requiring min assist to stand from commode.  Pt hopeful for dc home Tuesday.   Follow Up Recommendations  Home health PT     Equipment Recommendations  None recommended by PT    Recommendations for Other Services       Precautions / Restrictions Precautions Precautions: Fall Restrictions Weight Bearing Restrictions: No    Mobility  Bed Mobility               General bed mobility comments: up in chair and requests back to same    Transfers Overall transfer level: Needs assistance Equipment used: Rolling walker (2 wheeled) Transfers: Sit to/from Stand Sit to Stand: Supervision;Min assist         General transfer comment: supervision from recliner but min assist needed to rise up from comode  Ambulation/Gait Ambulation/Gait assistance: Supervision Gait Distance (Feet): 450 Feet (and 15' into bathroom) Assistive device: Rolling walker (2 wheeled) Gait Pattern/deviations: Step-through pattern Gait velocity: decr   General Gait Details: gait is slow and steady   Social research officer, government Rankin (Stroke Patients Only)       Balance Overall balance assessment: Mild deficits observed, not formally tested                                          Cognition Arousal/Alertness: Awake/alert Behavior During Therapy: WFL  for tasks assessed/performed Overall Cognitive Status: Within Functional Limits for tasks assessed                                        Exercises      General Comments General comments (skin integrity, edema, etc.): Vac in place R groin      Pertinent Vitals/Pain Pain Assessment: 0-10 Pain Score: 3  Pain Location: R hip Pain Descriptors / Indicators: Sore Pain Intervention(s): Limited activity within patient's tolerance;Monitored during session    Home Living                      Prior Function            PT Goals (current goals can now be found in the care plan section) Acute Rehab PT Goals Patient Stated Goal: to get this  wound healed PT Goal Formulation: With patient/family Time For Goal Achievement: 08/02/20 Potential to Achieve Goals: Good Progress towards PT goals: Progressing toward goals    Frequency    Min 3X/week      PT Plan Current plan remains appropriate    Co-evaluation              AM-PAC PT "6 Clicks" Mobility  Outcome Measure  Help needed turning from your back to your side while in a flat bed without using bedrails?: None Help needed moving from lying on your back to sitting on the side of a flat bed without using bedrails?: None Help needed moving to and from a bed to a chair (including a wheelchair)?: None Help needed standing up from a chair using your arms (e.g., wheelchair or bedside chair)?: None Help needed to walk in hospital room?: A Little Help needed climbing 3-5 steps with a railing? : A Little 6 Click Score: 22    End of Session Equipment Utilized During Treatment: Gait belt Activity Tolerance: Patient tolerated treatment well Patient left: in chair;with call bell/phone within reach;with family/visitor present Nurse Communication: Mobility status PT Visit Diagnosis: Difficulty in walking, not elsewhere classified (R26.2)     Time: 4818-5631 PT Time Calculation (min) (ACUTE ONLY): 28  min  Charges:  $Gait Training: 8-22 mins $Therapeutic Activity: 8-22 mins                     Mauro Kaufmann PT Acute Rehabilitation Services Pager [phone] Office [phone]    Monica Serrano 07/20/2020, 1:02 PM

## 2020-07-21 DIAGNOSIS — M25451 Effusion, right hip: Secondary | ICD-10-CM

## 2020-07-21 DIAGNOSIS — B964 Proteus (mirabilis) (morganii) as the cause of diseases classified elsewhere: Secondary | ICD-10-CM

## 2020-07-21 DIAGNOSIS — T8132XA Disruption of internal operation (surgical) wound, not elsewhere classified, initial encounter: Secondary | ICD-10-CM | POA: Diagnosis not present

## 2020-07-21 DIAGNOSIS — B951 Streptococcus, group B, as the cause of diseases classified elsewhere: Secondary | ICD-10-CM

## 2020-07-21 DIAGNOSIS — T8489XA Other specified complication of internal orthopedic prosthetic devices, implants and grafts, initial encounter: Secondary | ICD-10-CM

## 2020-07-21 LAB — BODY FLUID CULTURE
Culture: NO GROWTH
Special Requests: NORMAL

## 2020-07-21 NOTE — Progress Notes (Signed)
[facility] for Infectious Disease  Date of Admission:  07/15/2020           Reason for visit: Follow up on wound infection  ASSESSMENT:    1. Wound infection/dehiscence-secondary to strep agalactiae and Proteus mirabilis from wound swab.  Hip fluid aspirate cultures 2/10 no growth finalized 2. 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  CT 2/5 showed no fracture or dislocation and right hip prosthesis in place without evidence of loosening or bony destruction.  PLAN:    . Continue ceftriaxone 2 g daily . Plan for 4 weeks antibiotics.  Patient already has PICC line in place. . See OPAT note below   Diagnosis: Wound dehisence  Culture Result: Strep agalactiae and Proteus mirabilis  No Known Allergies  OPAT Orders Discharge antibiotics to be given via PICC line Discharge antibiotics: Per pharmacy protocol Ceftriaxone 2 gm daily Aim for Vancomycin trough 15-20 or AUC 400-550 (unless otherwise indicated) Duration: 4 weeks End Date: 08/12/20  [facility] Care Per Protocol:  Home health RN for IV administration and teaching; PICC line care and labs.    Labs weekly while on IV antibiotics: _x_ CBC with differential __ BMP _x_ CMP _x_ CRP _x_ ESR __ Vancomycin trough __ CK  _x_ Please pull PIC at completion of IV antibiotics __ Please leave PIC in place until doctor has seen patient or been notified  Fax weekly labs to [phone]  Clinic Follow Up Appt: 08/04/20 @ 930am with Dr Juleen China    SUBJECTIVE:   No new complaints Right leg feels tight No fevers or chills No pain with walking   ROS  As above.  Otherwise review of systems negative.   OBJECTIVE:   Blood pressure (!) 161/67, pulse 71, temperature 98.6 F (37 C), temperature source Oral, resp. rate 17, height $RemoveBe'5\' 5"'NuvMXLbAp$  (1.651 m),  weight 102.5 kg, SpO2 98 %. Body mass index is 37.61 kg/m.  Physical Exam Constitutional:      General: She is not in acute distress.    Appearance: Normal appearance.  Musculoskeletal:     Comments: Right upper extremity PICC line in place Right hip wound VAC in place  Neurological:     General: No focal deficit present.     Mental Status: She is alert and oriented to person, place, and time.  Psychiatric:        Mood and Affect: Mood normal.        Behavior: Behavior normal.      Lab Results: Lab Results  Component Value Date   WBC 7.8 07/20/2020   HGB 9.0 (L) 07/20/2020   HCT 29.7 (L) 07/20/2020   MCV 91.7 07/20/2020   PLT 516 (H) 07/20/2020    Lab Results  Component Value Date   NA 137 07/15/2020   K 3.8 07/15/2020   CO2 23 07/15/2020   GLUCOSE 92 07/15/2020   BUN 6 (L) 07/15/2020   CREATININE 0.85 07/15/2020   CALCIUM 9.5 07/15/2020   GFRNONAA >60 07/15/2020   No results found for: ALT, AST, GGT, ALKPHOS, BILITOT     Component Value Date/Time   CRP 1.5 (H) 07/20/2020 0430       Component Value Date/Time   ESRSEDRATE 75 (H) 07/15/2020 1617     I have reviewed the micro and lab results in Epic.    Jenna Luo  [facility] for Infectious Disease [facility] [phone] pager 07/21/2020, 10:24 AM  I spent greater than 35 minutes with the patient including greater than 50% of time in face to face counsel of the patient and in coordination of their care.

## 2020-07-21 NOTE — Progress Notes (Signed)
Patient ID: Monica Serrano, female   DOB: 11-Feb-1951, 70 y.o.   MRN: [id number] The patient's CRP and CBC have trended down nicely.  My plan is to change her VAC at the bedside either at noon today or at the end of the day.  Hopefully plans can be set in place for discharging her to home tomorrow.  Will await final recommendations as well from ID in terms of antibiotic duration.

## 2020-07-21 NOTE — Care Management Important Message (Signed)
Important Message  Patient Details IM Letter given to the Patient. Name: Monica Serrano MRN: [id number] Date of Birth: 12-Jul-1950   Medicare Important Message Given:  Yes     Caren Macadam 07/21/2020, 12:44 PM

## 2020-07-21 NOTE — TOC Initial Note (Signed)
Transition of Care [facility]) - Initial/Assessment Note    Patient Details  Name: Monica Serrano MRN: [id number] Date of Birth: 01-21-1951  Transition of Care [facility]) CM/SW Contact:    Lia Hopping, [place] Phone Number: 07/21/2020, 5:19 PM  Clinical Narrative:                 CSW met with the patient at bedside to discuss her home health needs/ IV antibiotics (Rocephin 2 g daily) for next six weeks. Patient will also need her VAC (KCI) sponge changed every 3-4 days for the next 6 to 8 weeks likely.  Patient already active with [facility] in [place], [place] for RN/PT. Referral made to [facility]. CSW made a referral to Carolynn Sayers w/ [facility] for IV antibiotics. She will follow up with patient today to provide education for PICC and administering medication.  Patient requested CSW update her daughter Manus Gunning. CSW updated Manus Gunning at bedside via phone.   TOC staff will continue to follow this patient  Expected Discharge Plan: [place] Barriers to Discharge: Continued Medical Work up   Patient Goals and CMS Choice Patient states their goals for this hospitalization and ongoing recovery are:: return home      Expected Discharge Plan and Services Expected Discharge Plan: [place] In-house Referral: Clinical Social Work Discharge Planning Services: CM Consult Post Acute Care Choice: [place] arrangements for the past 2 months: Single Family Home                           HH Arranged: PT,IV Antibiotics,RN HH Agency: [facility] Date HH Agency Contacted: 07/21/20 Time HH Agency Contacted: 1000 Representative spoke with at [place]: [place]/Amy  Prior Living Arrangements/Services Living arrangements for the past 2 months: [place] with:: Self Patient language and need for interpreter reviewed:: Yes Do you feel safe going back to the place where you live?: Yes      Need for Family Participation in Patient  Care: Yes (Comment) Care giver support system in place?: Yes (comment) (Brother) Current home services: DME Criminal Activity/Legal Involvement Pertinent to Current Situation/Hospitalization: No - Comment as needed  Activities of Daily Living Home Assistive Devices/Equipment: Walker (specify type),Bedside commode/3-in-1 (full upper denture, partial lower denture) ADL Screening (condition at time of admission) Patient's cognitive ability adequate to safely complete daily activities?: Yes Is the patient deaf or have difficulty hearing?: Yes (trouble hearing out of right ear) Does the patient have difficulty seeing, even when wearing glasses/contacts?: No Does the patient have difficulty concentrating, remembering, or making decisions?: No Patient able to express need for assistance with ADLs?: Yes Does the patient have difficulty dressing or bathing?: No Independently performs ADLs?: No Communication: Independent Dressing (OT): Independent Grooming: Independent Feeding: Independent Bathing: Independent Toileting: Needs assistance Is this a change from baseline?: Pre-admission baseline In/Out Bed: Needs assistance Is this a change from baseline?: Pre-admission baseline Walks in Home: Needs assistance Is this a change from baseline?: Pre-admission baseline Does the patient have difficulty walking or climbing stairs?: Yes Weakness of Legs: Both Weakness of Arms/Hands: Both  Permission Sought/Granted Permission sought to share information with : Case Manager,Family Supports Permission granted to share information with : Yes, Verbal Permission Granted  Share Information with NAME: Suzie Portela Daughter  Permission granted to share info w AGENCY: [place] granted to share info w Relationship: Daughter  Permission granted to share  info w Contact Information: [phone]  Emotional Assessment Appearance:: Appears stated age Attitude/Demeanor/Rapport: Engaged Affect  (typically observed): Accepting Orientation: : Oriented to Self,Oriented to Place,Oriented to  Time,Oriented to Situation Alcohol / Substance Use: Not Applicable Psych Involvement: No (comment)  Admission diagnosis:  Wound dehiscence [T81.30XA] Patient Active Problem List   Diagnosis Date Noted  . Wound dehiscence right hip 07/15/2020  . Status post revision of total hip 05/27/2020  . 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 Status post total replacement of right hip 03/21/2020  . Unilateral primary osteoarthritis, right hip 01/09/2020   PCP:  Mike Gip, MD Pharmacy:   [place] [address] [place] [address] Phone: [phone] Fax: [phone]     Social Determinants of Health (SDOH) Interventions    Readmission Risk Interventions No flowsheet data found.

## 2020-07-21 NOTE — Plan of Care (Signed)

## 2020-07-21 NOTE — Progress Notes (Signed)
Patient ID: Monica Serrano, female   DOB: February 22, 1951, 70 y.o.   MRN: [id number] I was able to change her right hip VAC at the bedside.  The wound looks great.  She does have her home VAC machine.  My plan would be to discharge her to home tomorrow.  We would just look up her current VAC sponge to her portable VAC which is at the bedside and will charged.  She will need home health nursing for her IV antibiotics and PICC line care.  She will be on Rocephin 2 g daily IV for the next 4 weeks.  She will need her VAC sponge changed every 3 to 4 days for the next 6 to 8 weeks likely.  She has had a home health nurse who specializes in wound care for about this already prior to this hospitalization.

## 2020-07-21 NOTE — Progress Notes (Signed)
PHARMACY CONSULT NOTE FOR:  OUTPATIENT  PARENTERAL ANTIBIOTIC THERAPY (OPAT)  Indication: Would infection/dehiscence of hip Regimen: ceftriaxone 2g IV q24h End date: 08/12/2020  IV antibiotic discharge orders are pended. To discharging provider:  please sign these orders via discharge navigator,  Select New Orders & click on the button choice - Manage This Unsigned Work.     Thank you for allowing pharmacy to be a part of this patient's care.  Jeannetta Nap 07/21/2020, 10:28 AM

## 2020-07-22 MED ORDER — CEFTRIAXONE IV (FOR PTA / DISCHARGE USE ONLY): 2.0000 g | INTRAVENOUS | 0 refills | Status: AC

## 2020-07-22 NOTE — Progress Notes (Signed)
Patient ID: Monica Serrano, female   DOB: Nov 24, 1950, 70 y.o.   MRN: [id number] Doing well overall.  I set up her home VAC at the bedside.  Can be discharged to home today.

## 2020-07-22 NOTE — Discharge Summary (Signed)
Patient ID: Monica Serrano MRN: [id number] DOB/AGE: 70-Jan-1952 70 y.o.  Admit date: 07/15/2020 Discharge date: 07/22/2020  Admission Diagnoses:  Principal Problem:   Wound dehiscence right hip   Discharge Diagnoses:  Same  Past Medical History:  Diagnosis Date  . Anxiety   . CKD (chronic kidney disease)   . Depression   . DVT, lower extremity ([place]) 2019   right leg  . 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 Headache    Migraines - years ago  . History of kidney stones    passed  . Hyperlipemia   . Hypertension   . Lymphedema   . Osteoarthritis   . Sleep apnea   . Vertigo     Surgeries:  on * No surgery found *   Consultants:   Discharged Condition: Improved  Hospital Course: Leshia Serrano is an 70 y.o. female who was admitted 07/15/2020 for an infection involving the superficial and deep tissues around her right total hip arthroplasty.  She already had a hip wound was being treated with outpatient wound care.  She then developed drainage from this area and some redness as well as had some fever and chills.  She was seen at outlying hospital.  Cultures the wound grew out strep and she was started on Rocephin daily IV.  She was transferred to our hospital appropriately since I have performed her previous surgeries.  In our hospital her white blood cell count and CRP came down properly on IV antibiotics.  She had hydrotherapy for 3 days in a row on the right hip wound and then a VAC was placed in the wound.  We did have radiology perform an aspiration of her right hip joint which showed minimal fluid.  Cultures from that were negative.  By the day of discharge she is mobilizing well in her room with normal vital signs and no significant discomfort at all.  Her VAC sponge been changed to a home VAC.  An infectious disease consult was obtained during this hospitalization and they have recommended 4 weeks of IV Rocephin.  This will be set up through home health agency as  well.  Patient was given perioperative antibiotics:  Anti-infectives (From admission, onward)   Start     Dose/Rate Route Frequency Ordered Stop   07/22/20 0000  cefTRIAXone (ROCEPHIN) IVPB        2 g Intravenous Every 24 hours 07/22/20 1227 08/13/20 2359   07/15/20 1600  cefTRIAXone (ROCEPHIN) 2 g in sodium chloride 0.9 % 100 mL IVPB        2 g 200 mL/hr over 30 Minutes Intravenous Daily-1800 07/15/20 1527         Patient was given sequential compression devices, early ambulation, and chemoprophylaxis to prevent DVT.  Patient benefited maximally from hospital stay and there were no complications.    Recent vital signs:  Patient Vitals for the past 24 hrs:  BP Temp Temp src Pulse Resp SpO2  07/22/20 0509 (!) 154/64 98.1 F (36.7 C) Oral 79 17 98 %  07/21/20 2117 (!) 148/59 97.9 F (36.6 C) Oral 75 17 100 %  07/21/20 1332 (!) 151/59 99.2 F (37.3 C) Axillary 79 16 98 %     Recent laboratory studies:  Recent Labs    07/20/20 0430  WBC 7.8  HGB 9.0*  HCT 29.7*  PLT 516*     Discharge Medications:   Allergies as of 07/22/2020   No Known Allergies     Medication List  STOP taking these medications   doxycycline 100 MG tablet Commonly known as: VIBRA-TABS   fluconazole 100 MG tablet Commonly known as: Diflucan   ondansetron 4 MG disintegrating tablet Commonly known as: Zofran ODT   oxyCODONE 5 MG immediate release tablet Commonly known as: Oxy IR/ROXICODONE     TAKE these medications   acetaminophen 500 MG tablet Commonly known as: TYLENOL Take 1,000 mg by mouth every 8 (eight) hours as needed for moderate pain or headache.   aspirin EC 81 MG tablet Take 81 mg by mouth daily. Swallow whole. What changed: Another medication with the same name was removed. Continue taking this medication, and follow the directions you see here.   atorvastatin 20 MG tablet Commonly known as: LIPITOR Take 20 mg by mouth daily.   candesartan 16 MG tablet Commonly known  as: ATACAND Take 16 mg by mouth daily.   cefTRIAXone  IVPB Commonly known as: ROCEPHIN Inject 2 g into the vein daily for 22 days. Indication:  Would infection/dehiscence of hip First Dose: No Last Day of Therapy:  08/12/2020 Labs - Once weekly:  CBC/D and BMP, Labs - Every other week:  ESR and CRP Method of administration: IV Push Method of administration may be changed at the discretion of home infusion pharmacist based upon assessment of the patient and/or caregiver's ability to self-administer the medication ordered.   cholecalciferol 25 MCG (1000 UNIT) tablet Commonly known as: VITAMIN D3 Take 2,000 Units by mouth daily.   ciprofloxacin 500 MG tablet Commonly known as: CIPRO Take 500 mg by mouth 2 (two) times daily. 5 day supply   docusate sodium 100 MG capsule Commonly known as: COLACE Take 100 mg by mouth every other day.   furosemide 80 MG tablet Commonly known as: LASIX Take 40 mg by mouth daily.   gabapentin 300 MG capsule Commonly known as: NEURONTIN Take 300 mg by mouth 4 (four) times daily.   lansoprazole 30 MG capsule Commonly known as: PREVACID Take 30 mg by mouth 2 (two) times daily.   oxyCODONE-acetaminophen 7.5-325 MG tablet Commonly known as: PERCOCET Take 1 tablet by mouth every 8 (eight) hours as needed for pain.   rOPINIRole 1 MG tablet Commonly known as: REQUIP Take 1 mg by mouth at bedtime.   spironolactone 25 MG tablet Commonly known as: ALDACTONE Take 25 mg by mouth daily.            Discharge Care Instructions  (From admission, onward)         Start     Ordered   07/22/20 0000  Change dressing on IV access line weekly and PRN  (Home infusion instructions - [facility] )        07/22/20 1227          Diagnostic Studies: DG Pelvis Portable  Result Date: 07/15/2020 CLINICAL DATA:  Right hip arthroplasty, pain, wound dehiscence EXAM: PORTABLE PELVIS 1-2 VIEWS COMPARISON:  06/10/2020 FINDINGS: Frontal view of the  pelvis and frontal view of the right hip are obtained. Right hip arthroplasty is in the expected position without signs of acute complication. No significant change since prior study. No acute bony abnormality. Soft tissue swelling overlying the right hip, with subcutaneous gas consistent with history of wound dehiscence. IMPRESSION: 1. Soft tissue swelling and subcutaneous gas overlying the right lateral hip, consistent with given history of wound dehiscence. 2. Unremarkable right hip arthroplasty. Electronically Signed   By: Randa Ngo M.D.   On: 07/15/2020 18:12   DG  FLUORO GUIDED NEEDLE PLC ASPIRATION/INJECTION LOC  Result Date: 07/17/2020 CLINICAL DATA:  Previous hip arthroplasty. Infected anterior wound. Small effusion described on CT. Aspiration requested EXAM: RIGHT HIP ASPIRATION  UNDER FLUOROSCOPY FLUOROSCOPY TIME:  0.4 MINUTE; 3 MGY TECHNIQUE: The procedure, risks (including but not limited to bleeding, infection, organ damage ), benefits, and alternatives were explained to the patient. Questions regarding the procedure were encouraged and answered. The patient understands and consents to the procedure. An appropriate skin entry site was determined under fluoroscopy. Site was marked, prepped with chlorhexidine, draped in usual sterile fashion, infiltrated locally with 1% lidocaine. An 18 gauge spinal needle was advanced to the neck of the femoral component of hip arthroplasty. No fluid returned on aspiration. Lavage with 1.5 mL nonbacteriostatic saline returned a scant amount of serosanguineous fluid, sent for the requested laboratory studies. The patient tolerated procedure well. COMPLICATIONS: None immediate IMPRESSION: 1. Technically successful right hip aspiration under fluoroscopy Electronically Signed   By: Lucrezia Europe M.D.   On: 07/17/2020 16:44   [place] EKG SITE RITE  Result Date: 07/17/2020 If Site Rite image not attached, placement could not be confirmed due to current cardiac  rhythm.   Disposition: Discharge disposition: 01-Home or Self Care       Discharge Instructions    [facility] pharmacist to adjust dose for Vancomycin, Aminoglycosides and other anti-infective therapies as requested by physician.   Complete by: As directed    [facility] to provide Cath Flo 2mg    Complete by: As directed    Administer for PICC line occlusion and as ordered by physician for other access device issues.   Anaphylaxis Kit: Provided to treat any anaphylactic reaction to the medication being provided to the patient if First Dose or when requested by physician   Complete by: As directed    Epinephrine 1mg /ml vial / amp: Administer 0.3mg  (0.45ml) subcutaneously once for moderate to severe anaphylaxis, nurse to call physician and pharmacy when reaction occurs and call 911 if needed for immediate care   Diphenhydramine 50mg /ml IV vial: Administer 25-50mg  IV/IM PRN for first dose reaction, rash, itching, mild reaction, nurse to call physician and pharmacy when reaction occurs   Sodium Chloride 0.9% NS 563ml IV: Administer if needed for hypovolemic blood pressure drop or as ordered by physician after call to physician with anaphylactic reaction   Change dressing on IV access line weekly and PRN   Complete by: As directed    Flush IV access with Sodium Chloride 0.9% and Heparin 10 units/ml or 100 units/ml   Complete by: As directed    Home infusion instructions - [facility]   Complete by: As directed    Instructions: Flush IV access with Sodium Chloride 0.9% and Heparin 10units/ml or 100units/ml   Change dressing on IV access line: Weekly and PRN   Instructions Cath Flo 2mg : Administer for PICC Line occlusion and as ordered by physician for other access device   [facility] pharmacist to adjust dose for: Vancomycin, Aminoglycosides and other anti-infective therapies as requested by physician   Method of administration may be changed at  the discretion of home infusion pharmacist based upon assessment of the patient and/or caregiver's ability to self-administer the medication ordered   Complete by: As directed          Signed: Mcarthur Rossetti 07/22/2020, 12:28 PM

## 2020-07-22 NOTE — Progress Notes (Signed)
Physical Therapy Treatment Patient Details Name: Monica Serrano MRN: [id number] DOB: [birth year] Today's Date: 07/22/2020    History of Present Illness Patient is 70 y/o female with PMH of CKD, DVT, lymphedema, HTN, OA, sleep apnea, and vertigo. Patient underwent R THA on 03/21/20. Patient s/p R THA revision on 05/27/20..  Ongoing dehiscence of incision, treated with wound VAC. Admitted 07/15/20 with cincerns for infection. Hydro therapy x 3 days, VAC placed 07/18/20.Marland Kitchen    PT Comments    Pt progressing toward goals. Reviewed exercises and gait progression. Pt states she is getting orthotics made, small lift for RLE. Continue to follow in acute setting. Pt is hopeful to d/c today.   Follow Up Recommendations  Home health PT;Follow surgeon's recommendation for DC plan and follow-up therapies     Equipment Recommendations  None recommended by PT    Recommendations for Other Services       Precautions / Restrictions Precautions Precautions: Fall Restrictions Weight Bearing Restrictions: No    Mobility  Bed Mobility               General bed mobility comments: EOB    Transfers Overall transfer level: Needs assistance Equipment used: Rolling walker (2 wheeled) Transfers: Sit to/from Stand Sit to Stand: Supervision;Modified independent (Device/Increase time)         General transfer comment: from bed and recliner, no physical assist  Ambulation/Gait Ambulation/Gait assistance: Supervision;Min guard Gait Distance (Feet): 420 Feet Assistive device: Rolling walker (2 wheeled) Gait Pattern/deviations: Step-through pattern Gait velocity: decr   General Gait Details: slow but steady gait, cues for posture, decr use of UEs, equal wt shift R to L LEs   Stairs             Wheelchair Mobility    Modified Rankin (Stroke Patients Only)       Balance                                            Cognition Arousal/Alertness: Awake/alert Behavior  During Therapy: WFL for tasks assessed/performed Overall Cognitive Status: Within Functional Limits for tasks assessed                                        Exercises Total Joint Exercises Ankle Circles/Pumps: AROM;Both;10 reps Quad Sets: AROM;Both;5 reps Heel Slides:  (reviewed, pt verbalizes)    General Comments        Pertinent Vitals/Pain Pain Assessment: No/denies pain    Home Living                      Prior Function            PT Goals (current goals can now be found in the care plan section) Acute Rehab PT Goals Patient Stated Goal: to get this  wound healed PT Goal Formulation: With patient/family Time For Goal Achievement: 08/02/20 Potential to Achieve Goals: Good Progress towards PT goals: Progressing toward goals    Frequency    Min 3X/week      PT Plan Current plan remains appropriate    Co-evaluation              AM-PAC PT "6 Clicks" Mobility   Outcome Measure  Help needed turning from your back to your side while in a  flat bed without using bedrails?: None Help needed moving from lying on your back to sitting on the side of a flat bed without using bedrails?: None Help needed moving to and from a bed to a chair (including a wheelchair)?: None Help needed standing up from a chair using your arms (e.g., wheelchair or bedside chair)?: None Help needed to walk in hospital room?: A Little Help needed climbing 3-5 steps with a railing? : A Little 6 Click Score: 22    End of Session   Activity Tolerance: Patient tolerated treatment well Patient left: in chair;with call bell/phone within reach   PT Visit Diagnosis: Difficulty in walking, not elsewhere classified (R26.2)     Time: 3570-1779 PT Time Calculation (min) (ACUTE ONLY): 26 min  Charges:  $Gait Training: 23-37 mins                     Delice Bison, PT  Acute Rehab Dept ([facility]) [phone] Pager [phone]  07/22/2020    [facility] 07/22/2020,  12:17 PM

## 2020-07-22 NOTE — Discharge Instructions (Signed)
Increase her activities as comfort allows. You may put all of your weight on your right hip as comfort allows. Home health should change your right hip back dressing only every 3 to 4 days minimum and not less than that.  It can go every 5 days if needed.

## 2020-07-24 ENCOUNTER — Telehealth: Payer: Self-pay | Admitting: Orthopaedic Surgery

## 2020-07-24 NOTE — Telephone Encounter (Signed)
error 

## 2020-07-28 ENCOUNTER — Telehealth: Payer: Self-pay | Admitting: Orthopaedic Surgery

## 2020-07-28 NOTE — Telephone Encounter (Signed)
Ok to give verbal ok?

## 2020-07-28 NOTE — Telephone Encounter (Signed)
[facility] called to get orders approved; she would like to twice a week for 4 weeks and then once a week for 2 weeks.   [facility] CB# [phone]

## 2020-07-28 NOTE — Telephone Encounter (Signed)
Yes

## 2020-07-28 NOTE — Telephone Encounter (Signed)
Called and advised.

## 2020-08-04 ENCOUNTER — Other Ambulatory Visit: Payer: Self-pay

## 2020-08-04 ENCOUNTER — Telehealth: Payer: Self-pay

## 2020-08-04 ENCOUNTER — Encounter: Payer: Self-pay | Admitting: Internal Medicine

## 2020-08-04 ENCOUNTER — Ambulatory Visit (INDEPENDENT_AMBULATORY_CARE_PROVIDER_SITE_OTHER): Payer: Medicare Other | Admitting: Orthopaedic Surgery

## 2020-08-04 ENCOUNTER — Ambulatory Visit (INDEPENDENT_AMBULATORY_CARE_PROVIDER_SITE_OTHER): Payer: Medicare Other | Admitting: Internal Medicine

## 2020-08-04 ENCOUNTER — Encounter: Payer: Self-pay | Admitting: Orthopaedic Surgery

## 2020-08-04 VITALS — Ht 65.0 in | Wt 232.0 lb

## 2020-08-04 DIAGNOSIS — T8489XA Other specified complication of internal orthopedic prosthetic devices, implants and grafts, initial encounter: Secondary | ICD-10-CM | POA: Diagnosis not present

## 2020-08-04 DIAGNOSIS — T8130XA Disruption of wound, unspecified, initial encounter: Secondary | ICD-10-CM | POA: Diagnosis not present

## 2020-08-04 DIAGNOSIS — B964 Proteus (mirabilis) (morganii) as the cause of diseases classified elsewhere: Secondary | ICD-10-CM

## 2020-08-04 DIAGNOSIS — B955 Unspecified streptococcus as the cause of diseases classified elsewhere: Secondary | ICD-10-CM | POA: Diagnosis not present

## 2020-08-04 DIAGNOSIS — Z96641 Presence of right artificial hip joint: Secondary | ICD-10-CM

## 2020-08-04 DIAGNOSIS — Z9889 Other specified postprocedural states: Secondary | ICD-10-CM

## 2020-08-04 NOTE — Telephone Encounter (Signed)
She can use her lymphedema suit and I am fine with this.  From a therapy standpoint, I am fine with her not having any more therapy other than her continued wound care treatment.

## 2020-08-04 NOTE — Telephone Encounter (Signed)
Patient called she would like to know if you will allow her to use her lymphedema suit at home, patient also stated the occupational therapy at [facility] would not service her as long as she is receiving home health care and [facility] does not approve outpatient pt patient would like a call back, call back:[phone]

## 2020-08-04 NOTE — Assessment & Plan Note (Signed)
She is doing well on abx for strep and proteus wound infection.  She will continue ceftriaxone 2gm daily with OPAT labs through 08/12/20 to complete 4 weeks of therapy for this complicated wound.  After completion, home health will remove her PICC line.  Agree that she would likely benefit for aggressive management of her bilateral lymphedema.  She is going to contact her PCP today regarding getting back into see them.  She can follow up with our clinic as needed.

## 2020-08-04 NOTE — Progress Notes (Signed)
[facility] for Infectious Disease  Reason for Consult: Wound infection  Referring Provider: Hospital follow up   HPI:    Monica Serrano is a 70 y.o. female with PMHx as below who presents to the clinic for wound infection.   She has a history of right total hip arthroplasty in October 9563 complicated by adipose necrosis and wound breakdown with loosening of acetabular components s/p return to the OR 05/27/20 with wound vac placement.  CT 2/5 showed no fracture or dislocation and right hip prosthesis in place without evidence of loosening or bony destruction.  She did have findings of wound dehiscence with infection secondary to strep agalactiae and proteus mirabilis from a swab.  Fluid aspirate cultures from her joint were negative.  She was discharged on 4 weeks of ceftriaxone via PICC line through 08/12/20.  She saw Dr Lanice Shirts earlier today and she is feeling well.  VAC sponge was removed from her right hip wound at that visit.  Wound noted to be tracking a little deep but not too much.  No redness or drainage and wound significantly smaller.  New VAC sponge was placed.  She does continue to have lymphedema and her surgeon thinks she may benefit from going back to their clinic.     Patient's Medications  New Prescriptions   No medications on file  Previous Medications   ACETAMINOPHEN (TYLENOL) 500 MG TABLET    Take 1,000 mg by mouth every 8 (eight) hours as needed for moderate pain or headache.   ASPIRIN EC 81 MG TABLET    Take 81 mg by mouth daily. Swallow whole.   ATORVASTATIN (LIPITOR) 20 MG TABLET    Take 20 mg by mouth daily.   CANDESARTAN (ATACAND) 16 MG TABLET    Take 16 mg by mouth daily.   CEFTRIAXONE (ROCEPHIN) IVPB    Inject 2 g into the vein daily for 22 days. Indication:  Would infection/dehiscence of hip First Dose: No Last Day of Therapy:  08/12/2020 Labs - Once weekly:  CBC/D and BMP, Labs - Every other week:  ESR and CRP Method of administration: IV  Push Method of administration may be changed at the discretion of home infusion pharmacist based upon assessment of the patient and/or caregiver's ability to self-administer the medication ordered.   CHOLECALCIFEROL (VITAMIN D3) 25 MCG (1000 UNIT) TABLET    Take 2,000 Units by mouth daily.   CIPROFLOXACIN (CIPRO) 500 MG TABLET    Take 500 mg by mouth 2 (two) times daily. 5 day supply   DOCUSATE SODIUM (COLACE) 100 MG CAPSULE    Take 100 mg by mouth every other day.   FUROSEMIDE (LASIX) 80 MG TABLET    Take 40 mg by mouth daily.   GABAPENTIN (NEURONTIN) 300 MG CAPSULE    Take 300 mg by mouth 4 (four) times daily.   LANSOPRAZOLE (PREVACID) 30 MG CAPSULE    Take 30 mg by mouth 2 (two) times daily.   OXYCODONE-ACETAMINOPHEN (PERCOCET) 7.5-325 MG TABLET    Take 1 tablet by mouth every 8 (eight) hours as needed for pain.   ROPINIROLE (REQUIP) 1 MG TABLET    Take 1 mg by mouth at bedtime.   SPIRONOLACTONE (ALDACTONE) 25 MG TABLET    Take 25 mg by mouth daily.  Modified Medications   No medications on file  Discontinued Medications   No medications on file      Past Medical History:  Diagnosis Date  . Anxiety   .  CKD (chronic kidney disease)   . Depression   . DVT, lower extremity ([place]) 2019   right leg  . 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 Headache    Migraines - years ago  . History of kidney stones    passed  . Hyperlipemia   . Hypertension   . Lymphedema   . Osteoarthritis   . Sleep apnea   . Vertigo     Social History   Tobacco Use  . Smoking status: Former Smoker    Years: 10.00    Types: Cigarettes    Quit date: 1988    Years since quitting: 34.1  . Smokeless tobacco: Never Used  Vaping Use  . Vaping Use: Never used  Substance Use Topics  . Alcohol use: Not Currently  . Drug use: Never    No family history on file.  No Known Allergies  Review of Systems  Constitutional: Negative for chills and fever.  Gastrointestinal: Negative for diarrhea,  nausea and vomiting.  Musculoskeletal: Negative for joint pain.  Skin: Negative for rash.       Improving wound.       OBJECTIVE:    Vitals:   08/04/20 1206  Weight: 232 lb (105.2 kg)  Height: _0  (1.651 m)     Body mass index is 38.61 kg/m.  Physical Exam Constitutional:      General: She is not in acute distress.    Appearance: Normal appearance.  HENT:     Head: Normocephalic and atraumatic.  Musculoskeletal:     Right lower leg: Edema present.     Left lower leg: Edema present.     Comments: Right hip VAC in place. Picc line in place.   Skin:    General: Skin is warm and dry.  Neurological:     General: No focal deficit present.     Mental Status: She is alert and oriented to person, place, and time.  Psychiatric:        Mood and Affect: Mood normal.        Behavior: Behavior normal.      Labs and Microbiology:  CBC Latest Ref Rng & Units 07/20/2020 07/17/2020 07/15/2020  WBC 4.0 - 10.5 K/uL 7.8 9.6 9.2  Hemoglobin 12.0 - 15.0 g/dL 9.0(L) 8.6(L) 8.7(L)  Hematocrit 36.0 - 46.0 % 29.7(L) 28.0(L) 28.0(L)  Platelets 150 - 400 K/uL 516(H) 426(H) 375   CMP Latest Ref Rng & Units 07/15/2020 05/28/2020 05/26/2020  Glucose 70 - 99 mg/dL 92 107(H) 98  BUN 8 - 23 mg/dL 6(L) 12 15  Creatinine 0.44 - 1.00 mg/dL 0.85 1.32(H) 1.39(H)  Sodium 135 - 145 mmol/L 137 135 136  Potassium 3.5 - 5.1 mmol/L 3.8 4.1 4.2  Chloride 98 - 111 mmol/L 106 101 100  CO2 22 - 32 mmol/L _1 Calcium 8.9 - 10.3 mg/dL 9.5 8.9 10.0     ASSESSMENT & PLAN:    Wound dehiscence right hip She is doing well on abx for strep and proteus wound infection.  She will continue ceftriaxone 2gm daily with OPAT labs through 08/12/20 to complete 4 weeks of therapy for this complicated wound.  After completion, home health will remove her PICC line.  Agree that she would likely benefit for aggressive management of her bilateral lymphedema.  She is going to contact her PCP today regarding getting back into  see them.  She can follow up with our clinic as needed.     Jenna Luo  [facility] for Infectious Disease [facility] 08/04/2020, 12:27 PM   I spent 30 minutes dedicated to the care of this patient on the date of this encounter to include pre-visit review of records, face-to-face time with the patient discussing wound infection, IV abx.

## 2020-08-04 NOTE — Progress Notes (Signed)
Ms. Skyles comes in today feeling well.  She is in postop follow-up for having dehiscence of her right hip wound.  At her last hospital stay we only have her on antibiotics and hydrotherapy on the wound itself but did not need to take her to the operating room.  She is now having VAC treatment from home health about 3 to 4 days apart.  I did remove the VAC sponge from her right hip wound today.  It tracks just a little bit deep but not much.  There is no redness and there is no drainage and the wound is significantly small.  I did place a new VAC sponge today.  She will continue local wound care.  She does have significant lymphedema in that right lower extremity and may need to go back to the lymphedema clinic.  I recommend still compressive garments.  I would like to see her back in 4 weeks to see how she is doing overall.  At that visit I would like an AP pelvis but that can be done supine.

## 2020-08-04 NOTE — Patient Instructions (Signed)
Thank you for coming to see me today. It was a pleasure seeing you.  To Do: Marland Kitchen Continue IV antibiotics through March 8 and then home health will take out your PICC line . Follow up with [place] as needed . Follow up with your primary doctor regarding the lymphedema clinic  If you have any questions or concerns, please do not hesitate to call the office at [phone].  Take Care,   Gwynn Burly, DO

## 2020-08-05 NOTE — Telephone Encounter (Signed)
LMOM for patient of the below message from [place]  

## 2020-08-08 ENCOUNTER — Telehealth: Payer: Self-pay

## 2020-08-08 NOTE — Telephone Encounter (Signed)
[facility] called stating that she would like to add lymphedema treatment.  Stated that patient is in a lot of pain.  CB# [phone].  Please advise.  Thank you.

## 2020-08-08 NOTE — Telephone Encounter (Signed)
JoJo with [facility] called. She is wanting to add on Lymphodema Management to patient's care for swelling. She has a lymphodema pump but patient states that it is not helping.  Please call her at #[phone] Thanks!

## 2020-08-11 ENCOUNTER — Encounter: Payer: Self-pay | Admitting: Orthopaedic Surgery

## 2020-08-11 NOTE — Telephone Encounter (Signed)
See below

## 2020-08-11 NOTE — Telephone Encounter (Signed)
Verbal order given  

## 2020-08-11 NOTE — Telephone Encounter (Signed)
Ok for them to perform lymphodema management

## 2020-08-13 ENCOUNTER — Telehealth: Payer: Self-pay | Admitting: Orthopaedic Surgery

## 2020-08-13 NOTE — Telephone Encounter (Signed)
[facility] RN called with updated info. Patient is continuing treatment for wound care. If any questions call [phone].

## 2020-09-01 ENCOUNTER — Ambulatory Visit: Payer: Medicare Other | Admitting: Orthopaedic Surgery

## 2020-09-01 ENCOUNTER — Ambulatory Visit: Payer: Self-pay

## 2020-09-01 ENCOUNTER — Encounter: Payer: Self-pay | Admitting: Orthopaedic Surgery

## 2020-09-01 DIAGNOSIS — Z96641 Presence of right artificial hip joint: Secondary | ICD-10-CM | POA: Diagnosis not present

## 2020-09-01 DIAGNOSIS — M25512 Pain in left shoulder: Secondary | ICD-10-CM | POA: Diagnosis not present

## 2020-09-01 DIAGNOSIS — M25511 Pain in right shoulder: Secondary | ICD-10-CM

## 2020-09-01 DIAGNOSIS — Z9889 Other specified postprocedural states: Secondary | ICD-10-CM | POA: Diagnosis not present

## 2020-09-01 DIAGNOSIS — G8929 Other chronic pain: Secondary | ICD-10-CM | POA: Diagnosis not present

## 2020-09-01 MED ORDER — LIDOCAINE HCL 1 % IJ SOLN
3.0000 mL | INTRAMUSCULAR | Status: AC | PRN
  Administered 2020-09-01: 3 mL

## 2020-09-01 MED ORDER — METHYLPREDNISOLONE ACETATE 40 MG/ML IJ SUSP
40.0000 mg | INTRAMUSCULAR | Status: AC | PRN
  Administered 2020-09-01: 40 mg via INTRA_ARTICULAR

## 2020-09-01 NOTE — Progress Notes (Signed)
Office Visit Note   Patient: Monica Serrano           Date of Birth: Mar 08, 1951           MRN: [id number] Visit Date: 09/01/2020              Requested by: Eulis Foster, MD [address] [place],  [place] [address] PCP: Eulis Foster, MD   Assessment & Plan: Visit Diagnoses:  1. Post-operative state   2. Status post total replacement of right hip   3. Chronic right shoulder pain   4. Chronic left shoulder pain     Plan: I did feel comfortable placing a steroid injection of the left shoulder today but would not do so in the right shoulder.  I recommended continued follow-up with the dermatologist for her right shoulder wound.  Her right hip wound can now be transitioned to just daily wet-to-dry dressings.  I would like to see her back in 6 weeks to see how she is doing overall.  If she is continue to have right shoulder issues we should obtain 3 views of both shoulders.  Follow-Up Instructions: No follow-ups on file.   Orders:  Orders Placed This Encounter  Procedures  . Large Joint Inj  . XR Pelvis 1-2 Views   No orders of the defined types were placed in this encounter.     Procedures: Large Joint Inj: L subacromial bursa on 09/01/2020 11:16 AM Indications: pain and diagnostic evaluation Details: 22 G 1.5 in needle  Arthrogram: No  Medications: 3 mL lidocaine 1 %; 40 mg methylPREDNISolone acetate 40 MG/ML Outcome: tolerated well, no immediate complications Procedure, treatment alternatives, risks and benefits explained, specific risks discussed. Consent was given by the patient. Immediately prior to procedure a time out was called to verify the correct patient, procedure, equipment, support staff and site/side marked as required. Patient was prepped and draped in the usual sterile fashion.       Clinical Data: No additional findings.   Subjective: Chief Complaint  Patient presents with  . Right Hip - Follow-up  The patient comes in today  for continued follow-up as it relates to her right hip.  She has had a chronic wound on the right hip following hip replacement surgery followed by a revision of a failed acetabular component.  She is ambulate with a rolling walker.  She has had a recent biopsy of her right shoulder that is worrisome to her.  She has chronic bilateral shoulder pain as well.  She is using a walker.  She has had home health wound treatment with VAC placement to the right hip wound.  Today she says of note wound is gotten significantly smaller and they want [place] to take a look at it to see if she is ready to transition to wet-to-dry dressings.  She is waiting the biopsy results from her right shoulder.  HPI  Review of Systems Today she denies any fever, chills, nausea, vomiting  Objective: Vital Signs: There were no vitals taken for this visit.  Physical Exam She is alert and oriented in no acute distress Ortho Exam Examination of her right hip shows the wound is healing nicely.  It is only now very small with no redness or drainage.  It is amenable to wet-to-dry dressings.  Her left shoulder has some global tenderness throughout its arc of motion with impingement.  The right shoulder is worrisome in terms of the suture area that they biopsied.  There  is 2 small blistering areas. Specialty Comments:  No specialty comments available.  Imaging: XR Pelvis 1-2 Views  Result Date: 09/01/2020 An AP pelvis shows a right total hip arthroplasty with no complicating features.  The acetabular component is in good position.    PMFS History: Patient Active Problem List   Diagnosis Date Noted  . Wound dehiscence right hip 07/15/2020  . Status post revision of total hip 05/27/2020  . 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 Status post total replacement of right hip 03/21/2020  . Unilateral primary osteoarthritis, right hip 01/09/2020   Past Medical History:  Diagnosis Date  . Anxiety   . CKD (chronic  kidney disease)   . Depression   . DVT, lower extremity (HCC) 2019   right leg  . 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 Headache    Migraines - years ago  . History of kidney stones    passed  . Hyperlipemia   . Hypertension   . Lymphedema   . Osteoarthritis   . Sleep apnea   . Vertigo     History reviewed. No pertinent family history.  Past Surgical History:  Procedure Laterality Date  . ANTERIOR HIP REVISION Right 05/27/2020   Procedure: REVISION ACETABULAR COMPONENT RIGHT HIP PLACEMENT OF WOUND VAC;  Surgeon: Kathryne Hitch, MD;  Location: [facility] OR;  Service: Orthopedics;  Laterality: Right;  . APPENDECTOMY    . BREAST SURGERY Right    Biospy  . EYE SURGERY Bilateral    Catartact  . JOINT REPLACEMENT     knees  . TOTAL HIP ARTHROPLASTY Right 03/21/2020   Procedure: RIGHT TOTAL HIP ARTHROPLASTY ANTERIOR APPROACH;  Surgeon: Kathryne Hitch, MD;  Location: [facility] OR;  Service: Orthopedics;  Laterality: Right;   Social History   Occupational History  . Not on file  Tobacco Use  . Smoking status: Former Smoker    Years: 10.00    Types: Cigarettes    Quit date: 1988    Years since quitting: 34.2  . Smokeless tobacco: Never Used  Vaping Use  . Vaping Use: Never used  Substance and Sexual Activity  . Alcohol use: Not Currently  . Drug use: Never  . Sexual activity: Not on file

## 2020-09-05 ENCOUNTER — Encounter: Payer: Self-pay | Admitting: Orthopaedic Surgery

## 2020-09-15 ENCOUNTER — Telehealth: Payer: Self-pay

## 2020-09-15 NOTE — Telephone Encounter (Signed)
Ok to give verbal 

## 2020-09-15 NOTE — Telephone Encounter (Signed)
JoJo called stating she is suppose to  Discharge pt although she isn't felling well and is declining in function. She would like to extend her PT to 2 times a week for 2 weeks and taper it down for 1 time a week for 2 weeks.

## 2020-09-15 NOTE — Telephone Encounter (Signed)
Called and gave verbal ok 

## 2020-09-15 NOTE — Telephone Encounter (Signed)
Did she give you a CB #?

## 2020-09-15 NOTE — Telephone Encounter (Signed)
[phone]   

## 2020-09-15 NOTE — Telephone Encounter (Signed)
I think that is reasonable.

## 2020-10-06 ENCOUNTER — Telehealth: Payer: Self-pay | Admitting: Orthopaedic Surgery

## 2020-10-06 MED ORDER — METHYLPREDNISOLONE 4 MG PO TABS: ORAL_TABLET | ORAL | 0 refills | Status: AC

## 2020-10-06 NOTE — Telephone Encounter (Signed)
Patient aware this called in for her

## 2020-10-06 NOTE — Telephone Encounter (Signed)
Patient called requesting a call back from [facility]. Patient did not disclose reasoning for call. Patient phone number is [phone].

## 2020-10-06 NOTE — Telephone Encounter (Signed)
I sent in a dose pack

## 2020-10-06 NOTE — Telephone Encounter (Signed)
Patient states after PT she has the worst scitica She can barely walk She is wondering if maybe she can have a dose pack?

## 2020-10-13 ENCOUNTER — Ambulatory Visit (INDEPENDENT_AMBULATORY_CARE_PROVIDER_SITE_OTHER): Payer: Medicare Other | Admitting: Orthopaedic Surgery

## 2020-10-13 ENCOUNTER — Encounter: Payer: Self-pay | Admitting: Orthopaedic Surgery

## 2020-10-13 ENCOUNTER — Ambulatory Visit: Payer: Self-pay

## 2020-10-13 DIAGNOSIS — Z96641 Presence of right artificial hip joint: Secondary | ICD-10-CM | POA: Diagnosis not present

## 2020-10-13 DIAGNOSIS — M19011 Primary osteoarthritis, right shoulder: Secondary | ICD-10-CM

## 2020-10-13 DIAGNOSIS — M19012 Primary osteoarthritis, left shoulder: Secondary | ICD-10-CM | POA: Diagnosis not present

## 2020-10-13 NOTE — Progress Notes (Signed)
Office Visit Note   Patient: Justyn Boyson           Date of Birth: [birth year]           MRN: [id number] Visit Date: 10/13/2020              Requested by: Eulis Foster, MD [address] [place],  [place] [address] PCP: Eulis Foster, MD   Assessment & Plan: Visit Diagnoses:  1. History of right hip replacement   2. Primary osteoarthritis of left shoulder   3. Primary osteoarthritis of right shoulder     Plan: Patient did ask about cortisone injection subacromial right shoulder would not recommend due to the fact that she continues to have drainage and redness about the shoulder.  She may benefit in the future referral for possible shoulder replacements but at this point time is not ready to undergo any other surgery.  In regards to her right hip we will send her to outpatient physical therapy to work on gait and balance.  Questions encouraged and answered by Dr. Magnus Ivan self.  We will see her back in 3 months sooner if there is any questions concerns.  Follow-Up Instructions: Return in about 3 months (around 01/13/2021).   Orders:  Orders Placed This Encounter  Procedures  . XR HIP UNILAT W OR W/O PELVIS 1V RIGHT  . XR Shoulder Left  . XR Shoulder Right   No orders of the defined types were placed in this encounter.     Procedures: No procedures performed   Clinical Data: No additional findings.   Subjective: Chief Complaint  Patient presents with  . Right Hip - Follow-up  . Right Shoulder - Pain  . Left Shoulder - Pain    HPI Mrs. Arpin returns today for follow-up of her right hip.  She is status post right hip revision 05/27/2020.  States her right hip is overall doing well.  She notes that the incisions healed well.  Her only complaint is she feels like she could benefit from further therapy to mainly work on gait and balance and to hopefully get off the rolling walker.  She also had sciatica for which Medrol Dosepak was called in for her  she states that the sciatica is now resolved.  Main complaint today is bilateral shoulder pain.  She notes that the left shoulder subacromial injection 09/01/2020 did help some.  She still has draining wound on the right shoulder she states biopsy from her dermatologist showed some type of virus.  Review of Systems See HPI otherwise negative or noncontributory  Objective: Vital Signs: There were no vitals taken for this visit.  Physical Exam Constitutional:      Appearance: She is not ill-appearing or diaphoretic.  Pulmonary:     Effort: Pulmonary effort is normal.  Neurological:     Mental Status: She is alert and oriented to person, place, and time.  Psychiatric:        Mood and Affect: Mood normal.     Ortho Exam Right hip good range of motion without pain.  She ambulates with a rolling walker.  Bilateral shoulders she has limited active forward flexion to approximately 85 degrees.  Passively and bring her to 120 degrees of forward flexion bilaterally.  Internal and external rotation both shoulders causes pain and there is significant crepitus bilaterally. Specialty Comments:  No specialty comments available.  Imaging: XR HIP UNILAT W OR W/O PELVIS 1V RIGHT  Result Date: 10/13/2020 AP pelvis lateral  view of the right hip: Status post right total hip arthroplasty.  No worrisome features.  Components appear well-seated.  Bilateral hips well located.  XR Shoulder Left  Result Date: 10/13/2020 Left shoulder 3 views: Shoulders located.  Cystic changes and severe arthritic changes involving the left humeral head.  No acute fractures.  XR Shoulder Right  Result Date: 10/13/2020 Right shoulder 3 views: Shows severe end-stage arthritis.  Sclerotic changes about the humeral head and cystic changes within the humeral head.  Shoulder is well located.  No acute fractures.    PMFS History: Patient Active Problem List   Diagnosis Date Noted  . Wound dehiscence right hip 07/15/2020  .  Status post revision of total hip 05/27/2020  . 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 Status post total replacement of right hip 03/21/2020  . Unilateral primary osteoarthritis, right hip 01/09/2020   Past Medical History:  Diagnosis Date  . Anxiety   . CKD (chronic kidney disease)   . Depression   . DVT, lower extremity (HCC) 2019   right leg  . 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 Headache    Migraines - years ago  . History of kidney stones    passed  . Hyperlipemia   . Hypertension   . Lymphedema   . Osteoarthritis   . Sleep apnea   . Vertigo     History reviewed. No pertinent family history.  Past Surgical History:  Procedure Laterality Date  . ANTERIOR HIP REVISION Right 05/27/2020   Procedure: REVISION ACETABULAR COMPONENT RIGHT HIP PLACEMENT OF WOUND VAC;  Surgeon: Kathryne Hitch, MD;  Location: [facility] OR;  Service: Orthopedics;  Laterality: Right;  . APPENDECTOMY    . BREAST SURGERY Right    Biospy  . EYE SURGERY Bilateral    Catartact  . JOINT REPLACEMENT     knees  . TOTAL HIP ARTHROPLASTY Right 03/21/2020   Procedure: RIGHT TOTAL HIP ARTHROPLASTY ANTERIOR APPROACH;  Surgeon: Kathryne Hitch, MD;  Location: [facility] OR;  Service: Orthopedics;  Laterality: Right;   Social History   Occupational History  . Not on file  Tobacco Use  . Smoking status: Former Smoker    Years: 10.00    Types: Cigarettes    Quit date: 1988    Years since quitting: 34.3  . Smokeless tobacco: Never Used  Vaping Use  . Vaping Use: Never used  Substance and Sexual Activity  . Alcohol use: Not Currently  . Drug use: Never  . Sexual activity: Not on file

## 2021-01-05 ENCOUNTER — Telehealth: Payer: Self-pay | Admitting: Orthopaedic Surgery

## 2021-01-05 MED ORDER — PREDNISONE 10 MG PO TABS: ORAL_TABLET | ORAL | 0 refills | Status: AC

## 2021-01-05 NOTE — Telephone Encounter (Signed)
Called pt and advised.  

## 2021-01-05 NOTE — Telephone Encounter (Signed)
Can you advise on this please since Dr. Magnus Ivan isnt in the office this week

## 2021-01-05 NOTE — Telephone Encounter (Signed)
Pt in severe pain from sciatica and asked that it be an urgent message. Pt asking if Dr. Magnus Ivan can call in some steroids to help with pain. Pt asking for a call back once its been filled and the best pharmacy is the one on file. The best call back number is [phone].

## 2021-01-13 ENCOUNTER — Other Ambulatory Visit: Payer: Self-pay

## 2021-01-13 ENCOUNTER — Ambulatory Visit: Payer: Self-pay

## 2021-01-13 ENCOUNTER — Encounter: Payer: Self-pay | Admitting: Orthopaedic Surgery

## 2021-01-13 ENCOUNTER — Ambulatory Visit (INDEPENDENT_AMBULATORY_CARE_PROVIDER_SITE_OTHER): Payer: Medicare Other | Admitting: Orthopaedic Surgery

## 2021-01-13 DIAGNOSIS — M545 Low back pain, unspecified: Secondary | ICD-10-CM | POA: Diagnosis not present

## 2021-01-13 DIAGNOSIS — Z96641 Presence of right artificial hip joint: Secondary | ICD-10-CM | POA: Diagnosis not present

## 2021-01-13 DIAGNOSIS — M4807 Spinal stenosis, lumbosacral region: Secondary | ICD-10-CM

## 2021-01-13 NOTE — Progress Notes (Signed)
The patient continues to have significant right-sided sciatic symptoms.  She is on chronic oxycodone and Neurontin.  We actually performed a right hip replacement and then a revision of the failed acetabular component.  She has had also a chronic wound that is eventually healed over on the right hip.  She has a history of a right knee replacement and some shoulder issues as well.  Examination of her right hip does show the move smoothly and fluidly.  She has a positive straight leg raise on the right side and significant pain down her right lower extremity with numbness and tingling in the L4 and L5 distribution.  She is definitely uncomfortable.  She is ambulate with a cane.  An AP and lateral lumbar spine shows a significant spondylolisthesis at L4 and L5 as well as degenerative disc disease between L5 and S1.  The acetabular component of her right hip revision arthroplasty can be seen and it is in a good position and looks like it is going to the bone with no issues at all.  I am concerned about the sciatica that she is having and the radiographic findings on plain films of her lumbar spine.  A MRI at this point is warranted to the lumbar spine to assess for nerve compression since affecting her right side and causing the right-sided sciatica.  We will see her back once we have these MRI results.  All questions and concerns were answered and addressed.

## 2021-01-28 ENCOUNTER — Other Ambulatory Visit: Payer: Medicare Other

## 2021-01-29 ENCOUNTER — Ambulatory Visit
Admission: RE | Admit: 2021-01-29 | Discharge: 2021-01-29 | Disposition: A | Discharge status: Home / Self Care | Payer: Medicare Other | Source: Ambulatory Visit | Attending: Orthopaedic Surgery | Admitting: Orthopaedic Surgery

## 2021-01-29 ENCOUNTER — Other Ambulatory Visit: Payer: Self-pay

## 2021-01-29 DIAGNOSIS — M4807 Spinal stenosis, lumbosacral region: Secondary | ICD-10-CM

## 2021-01-29 IMAGING — MR MR LUMBAR SPINE W/O CM
4 of 5 series · 25 of 48 positions shown · non-contrast
Comparison: Radiographs [DATE].

CLINICAL DATA: Low back pain with right leg sciatica. Assess nerve
compression L4-5. Spinal stenosis.

EXAM:
MRI LUMBAR SPINE WITHOUT CONTRAST
TECHNIQUE: Multiplanar, multisequence MR imaging of the lumbar spine was
performed. No intravenous contrast was administered.

[Series 3: T2 · sagittal · 4.0mm · 0.53mm/px · 6 of 15 slices shown (1 of 2)]
[im 1/15]
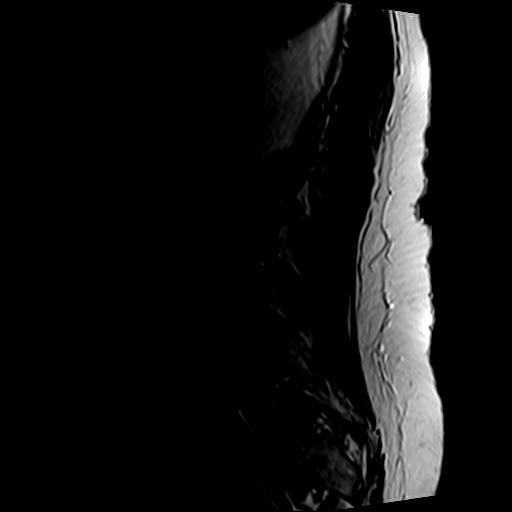
[im 3/15]
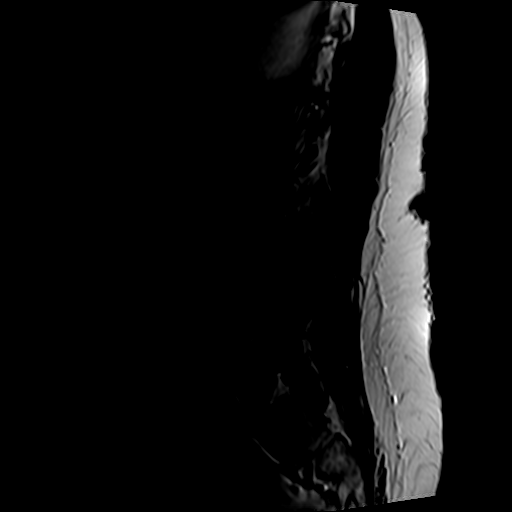
[im 6/15]
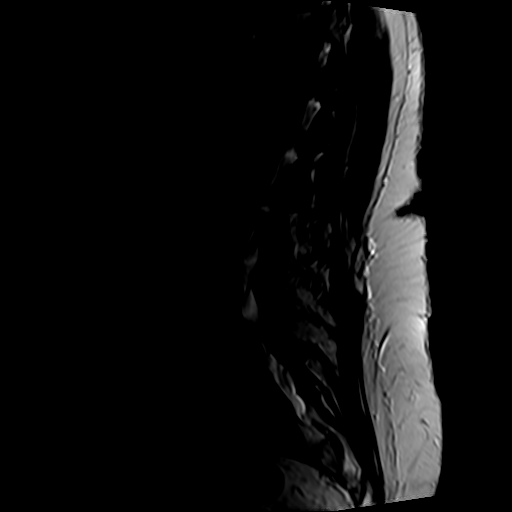
[im 9/15]
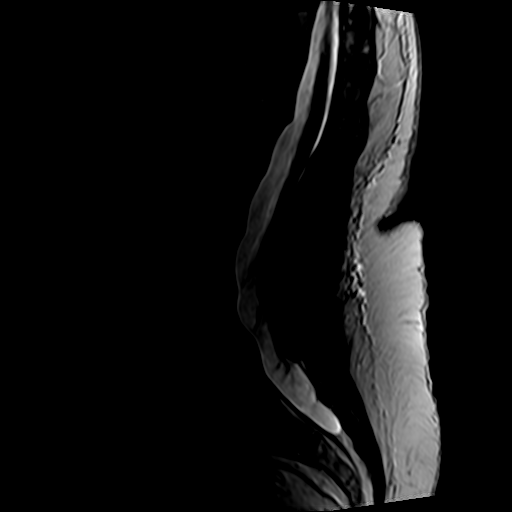
[im 12/15]
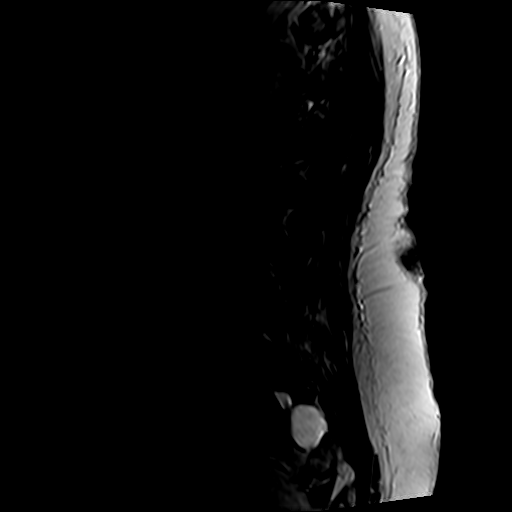
[im 15/15]
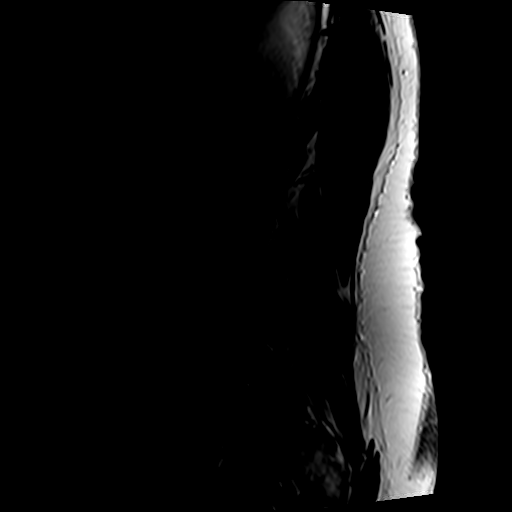

[Series 5: T1 · sagittal · 4.0mm · 0.53mm/px · 6 of 15 slices shown (1 of 2)]
[im 1/15]
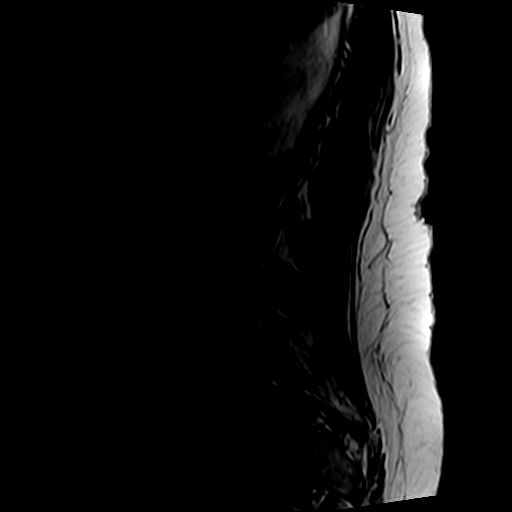
[im 3/15]
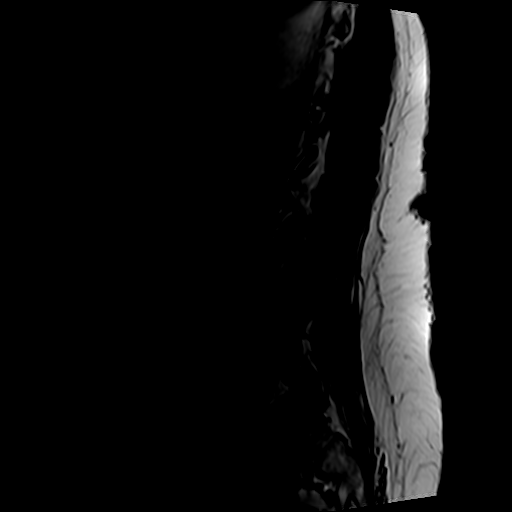
[im 6/15]
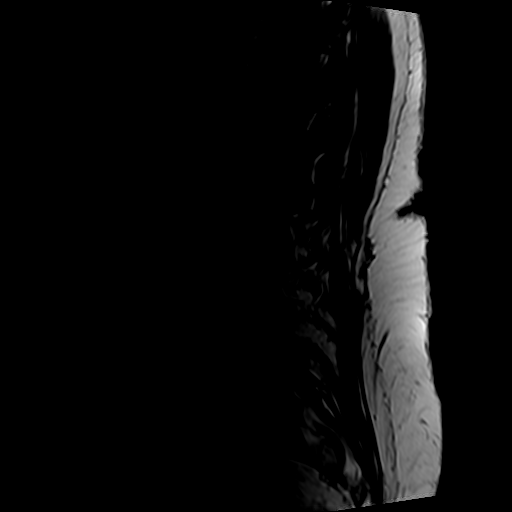
[im 9/15]
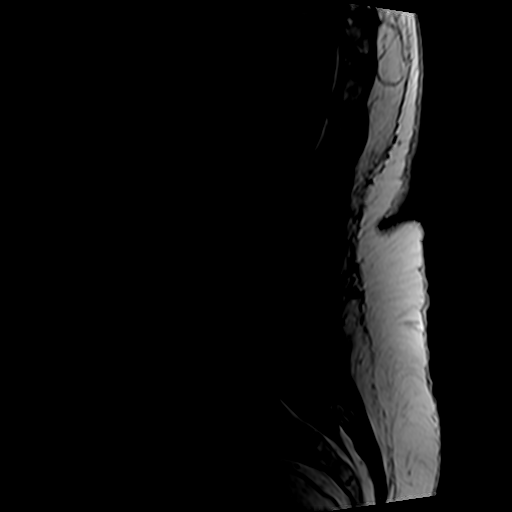
[im 12/15]
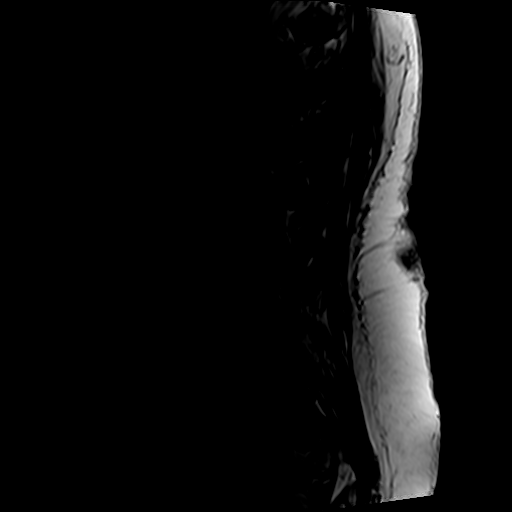
[im 15/15]
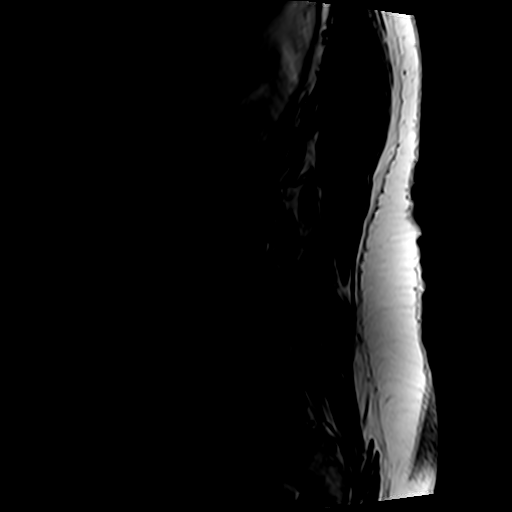

[Series 6: T2 · axial · 4.0mm · 0.70mm/px · z∈[-13,+191]mm · 9 of 37 slices shown (2 of 2)]
[im 1/37]
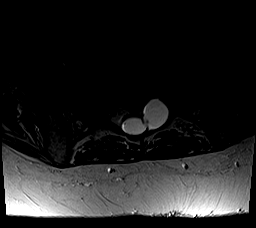
[im 6/37]
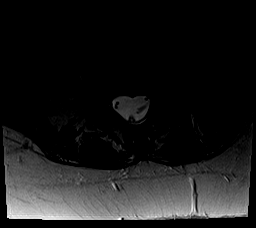
[im 11/37]
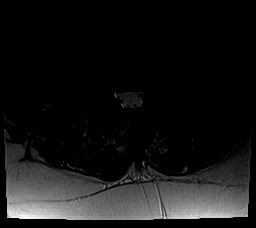
[im 16/37]
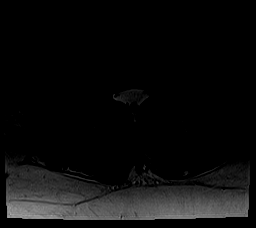
[im 19/37]
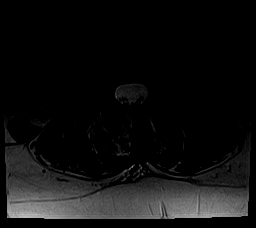
[im 21/37]
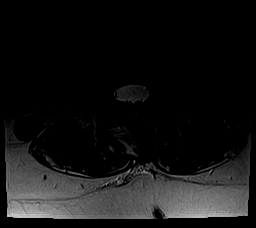
[im 26/37]
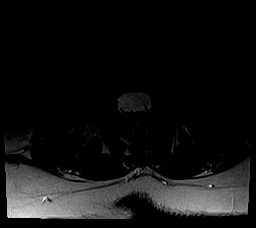
[im 31/37]
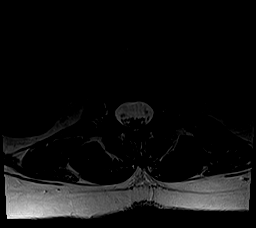
[im 37/37]
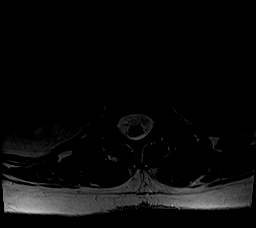

[Series 7: T1 · axial · 4.0mm · 0.35mm/px · z∈[-13,+160]mm · 4 of 37 slices shown (2 of 2)]
[im 1/37]
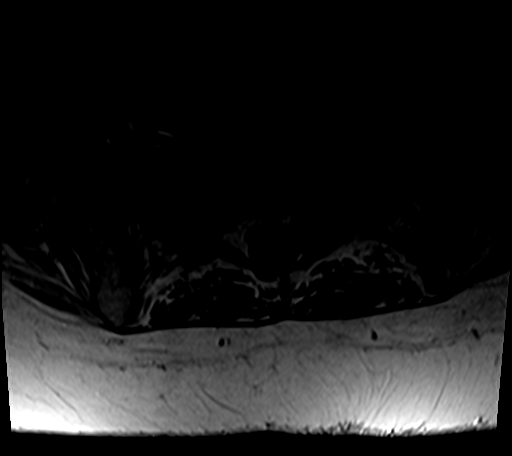
[im 6/37]
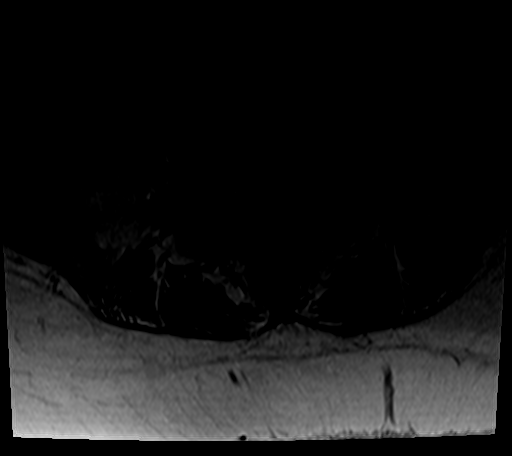
[im 19/37]
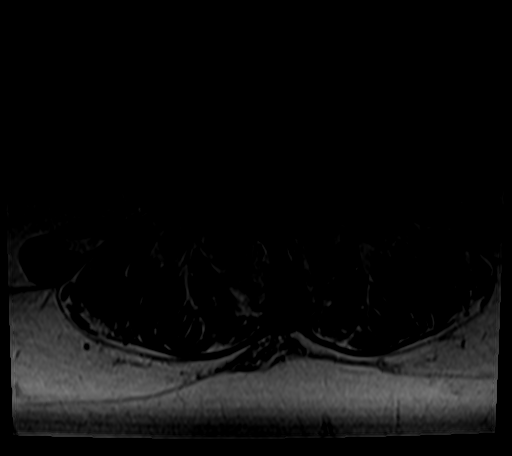
[im 31/37]
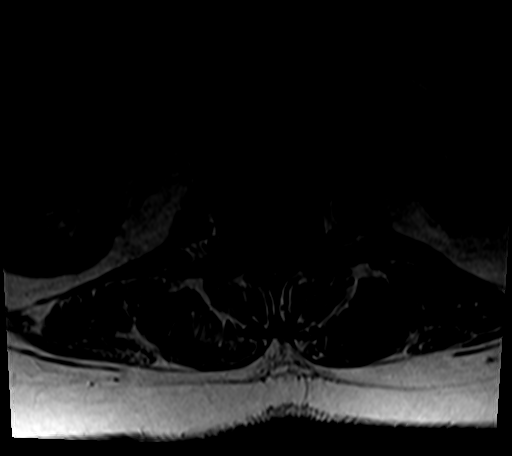

[25 of 48 positions shown; findings below may reference images not displayed]

FINDINGS: Segmentation: Conventional anatomy assumed, with the last open disc
space designated L5-S1.

Alignment: 5 mm of anterolisthesis at L4-5 secondary to facet
disease.

Vertebrae: No worrisome osseous lesion, acute fracture or pars
defect. Left-sided sacral Tarlov cysts with mild smooth erosion of
the upper left sacrum. The visualized sacroiliac joints appear
unremarkable.

Conus medullaris: Extends to the L1-2 level and appears normal.

Paraspinal and other soft tissues: No significant paraspinal
findings.

Disc levels:

T12-L1: Loss of disc height with annular disc bulging. No spinal
stenosis or nerve root encroachment.

L1-2: Normal interspace.

L2-3: Minimal disc bulging and facet hypertrophy. No spinal stenosis
or nerve root encroachment.

L3-4: Minimal disc bulging and mild bilateral facet hypertrophy. No
spinal stenosis or nerve root encroachment.

L4-5: Loss of disc height with annular disc bulging and uncovering
due to the anterolisthesis. There is moderate to advanced bilateral
facet hypertrophy with resulting mild narrowing of both lateral
recesses. In addition, there is mild right and moderate left
foraminal narrowing. The spinal canal is adequately patent.

L5-S1: Disc height and hydration are maintained. Mild bilateral
facet hypertrophy. No spinal stenosis or nerve root encroachment.
IMPRESSION: 1. Degenerative anterolisthesis at L4-5 contributes to left greater
than right foraminal narrowing and mild narrowing of both lateral
recesses. No significant central spinal stenosis or clear
right-sided nerve root encroachment.
2. No other significant spinal stenosis or nerve root encroachment.
Additional mild disc degeneration and facet hypertrophy as
described.

## 2021-01-31 ENCOUNTER — Other Ambulatory Visit: Payer: Medicare Other

## 2021-02-04 ENCOUNTER — Telehealth: Payer: Self-pay | Admitting: Orthopaedic Surgery

## 2021-02-04 NOTE — Telephone Encounter (Signed)
Pt called requesting to have MRI results read over the phone due to Dr. Magnus Ivan and [facility] schedule being far out. Please call pt at [phone].

## 2021-02-09 ENCOUNTER — Encounter: Payer: Self-pay | Admitting: Orthopaedic Surgery

## 2021-02-10 ENCOUNTER — Other Ambulatory Visit: Payer: Self-pay

## 2021-02-10 DIAGNOSIS — Z96641 Presence of right artificial hip joint: Secondary | ICD-10-CM

## 2021-02-27 ENCOUNTER — Ambulatory Visit
Admission: RE | Admit: 2021-02-27 | Discharge: 2021-02-27 | Disposition: A | Discharge status: Home / Self Care | Payer: Medicare Other | Source: Ambulatory Visit | Attending: Orthopaedic Surgery | Admitting: Orthopaedic Surgery

## 2021-02-27 ENCOUNTER — Other Ambulatory Visit: Payer: Self-pay

## 2021-02-27 DIAGNOSIS — Z96641 Presence of right artificial hip joint: Secondary | ICD-10-CM

## 2021-02-27 IMAGING — MR MR HIP*R* W/O CM
4 of 5 series · 22 of 40 positions shown · non-contrast
Comparison: None.

CLINICAL DATA: Chronic hip pain.  Radiates down to the knee.

EXAM:
MR OF THE RIGHT HIP WITHOUT CONTRAST
TECHNIQUE: Multiplanar, multisequence MR imaging was performed. No intravenous
contrast was administered.

[Series 4: T1 · coronal · 4.0mm · 0.78mm/px · 7 of 32 slices shown]
[im 1/32]
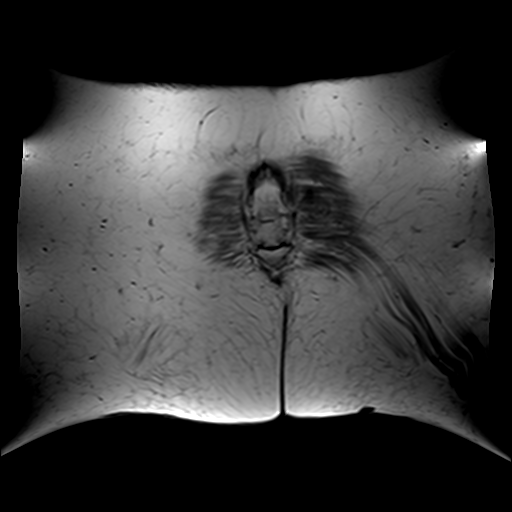
[im 6/32]
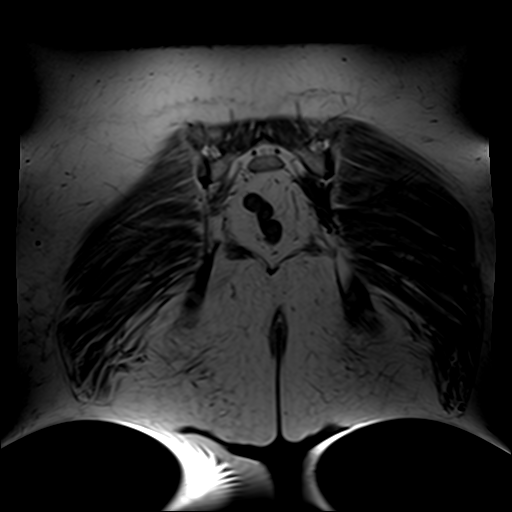
[im 11/32]
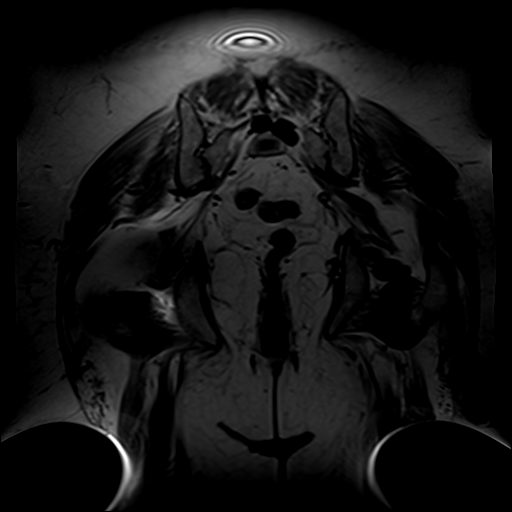
[im 16/32]
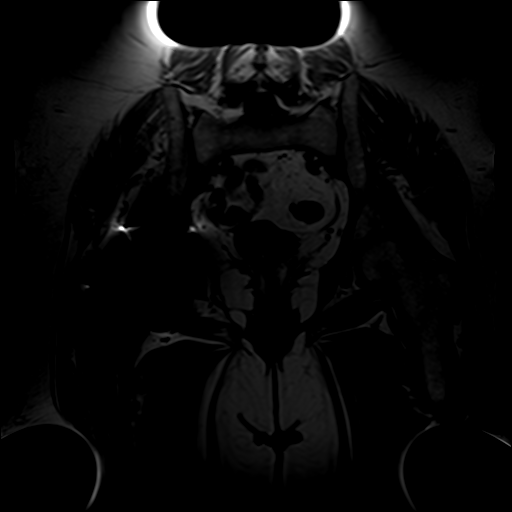
[im 21/32]
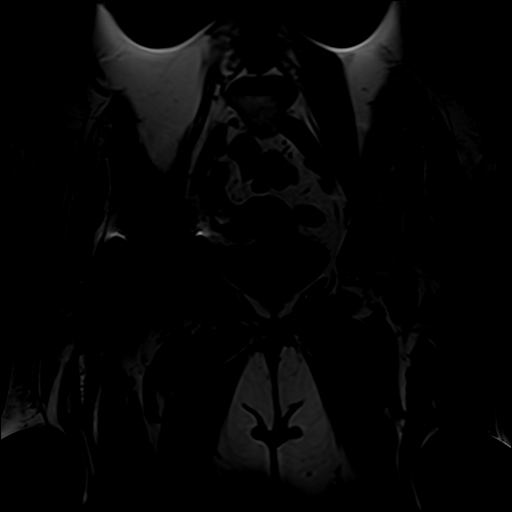
[im 26/32]
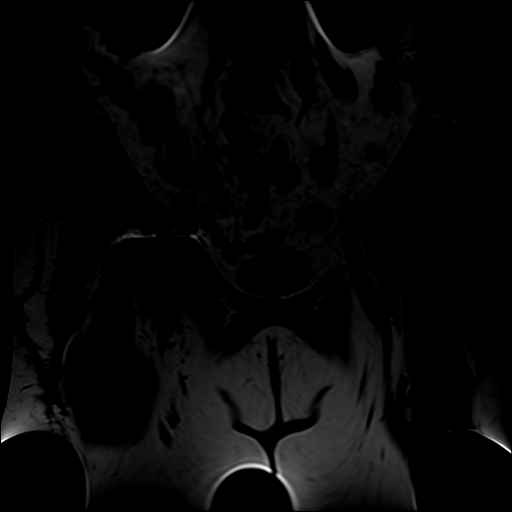
[im 32/32]
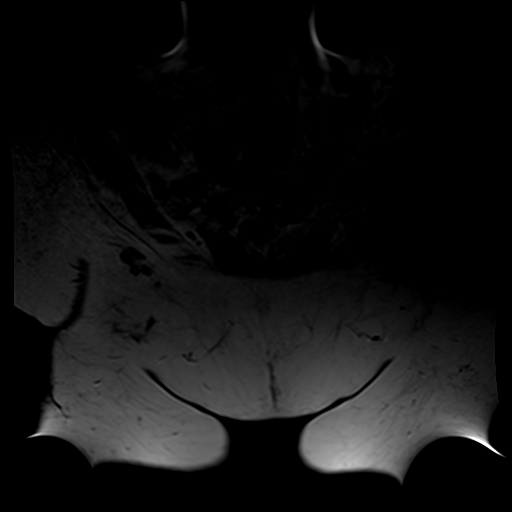

[Series 5: T2 fat-sat · coronal · 4.0mm · 0.78mm/px · 7 of 32 slices shown (1 of 2)]
[im 1/32]
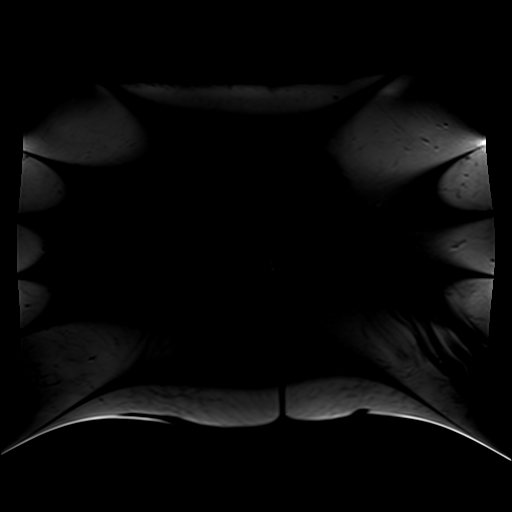
[im 6/32]
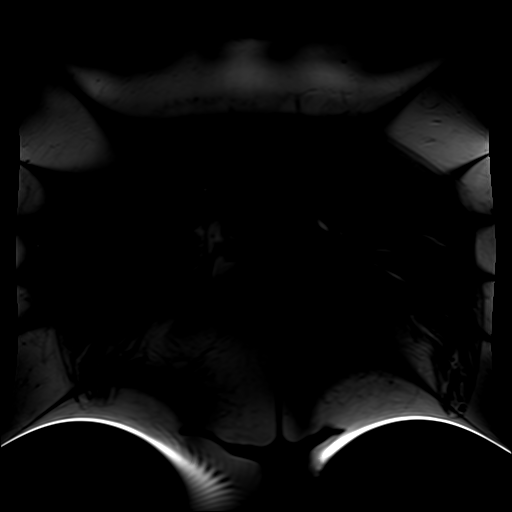
[im 11/32]
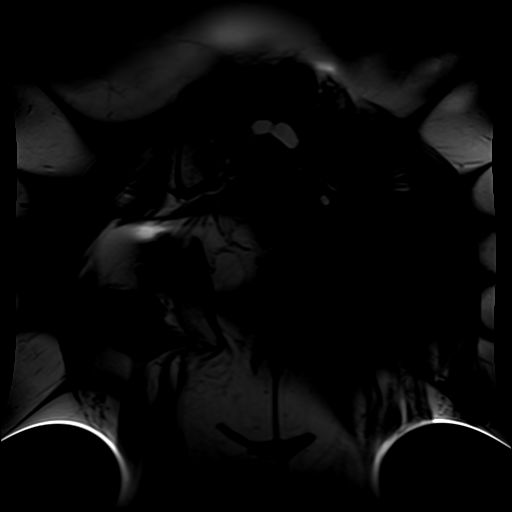
[im 16/32]
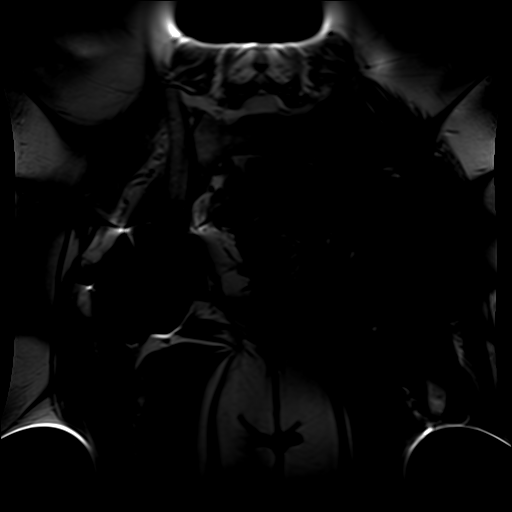
[im 21/32]
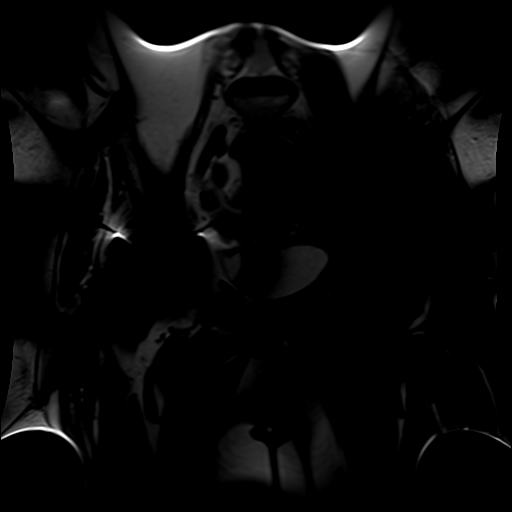
[im 26/32]
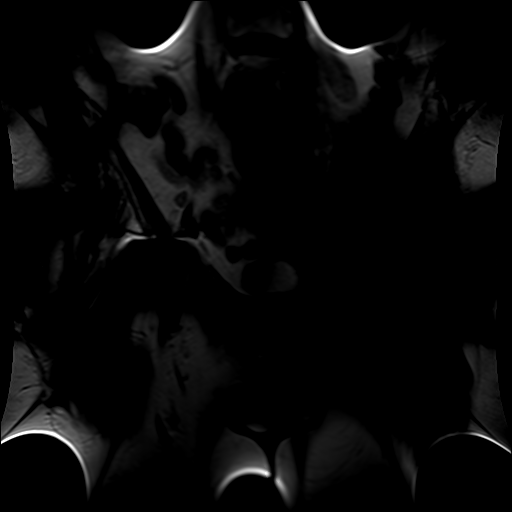
[im 32/32]
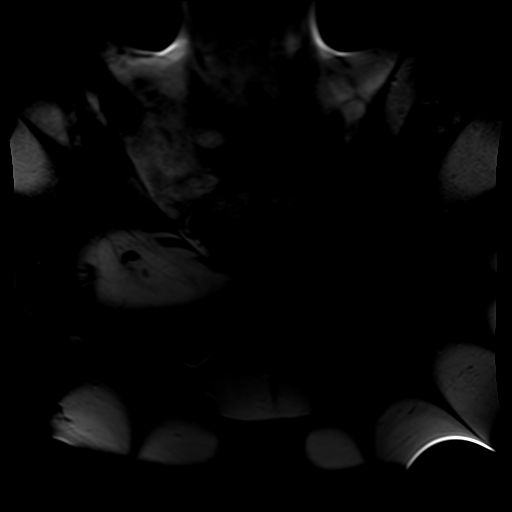

[Series 6: T2 fat-sat · axial · 4.0mm · 0.78mm/px · z∈[-94,+126]mm · 5 of 50 slices shown (2 of 2)]
[im 1/50]
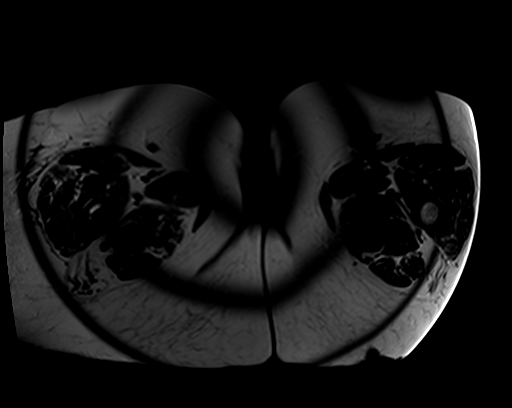
[im 5/50]
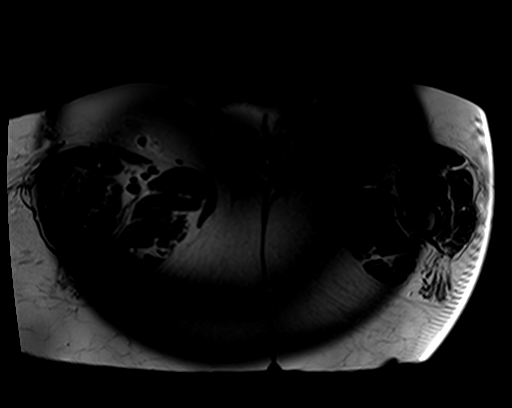
[im 10/50]
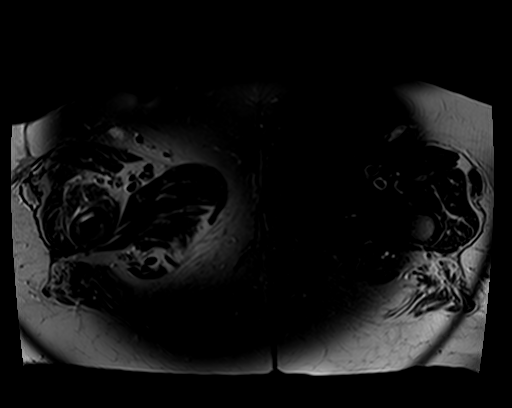
[im 25/50]
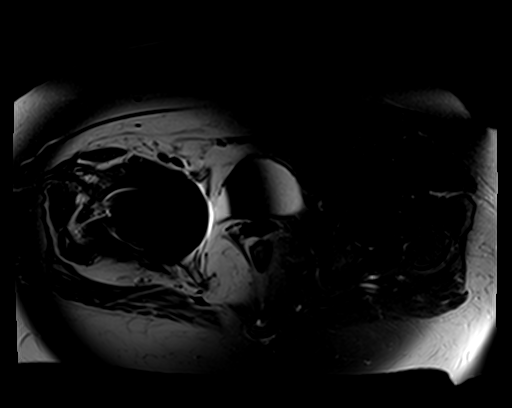
[im 45/50]
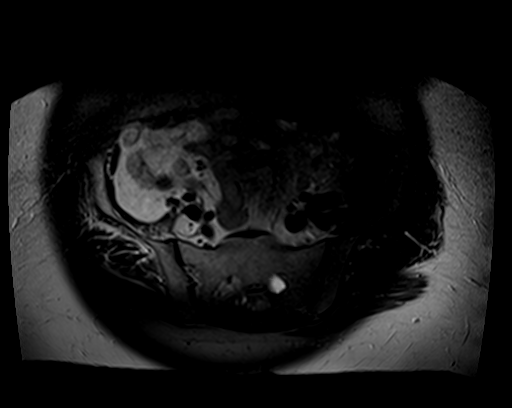

[Series 7: PD fat-sat · sagittal · 4.0mm · 0.82mm/px · 3 of 38 slices shown]
[im 6/38]
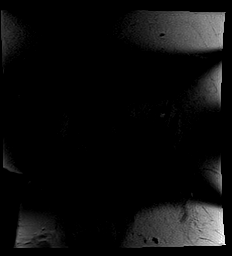
[im 22/38]
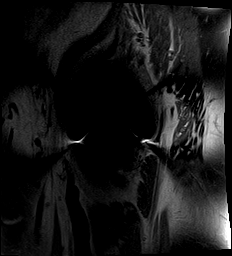
[im 32/38]
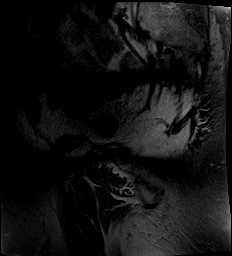

[22 of 40 positions shown; findings below may reference images not displayed]

FINDINGS: Bones:

No left hip fracture, dislocation or avascular necrosis.

Right total hip arthroplasty with susceptibility artifact partially
obscuring the adjacent soft tissue and osseous structures. No
periarticular fluid collection or osteolysis. No hardware failure or
complication.

No periosteal reaction or bone destruction. No aggressive osseous
lesion.

Mild osteoarthritis of bilateral SI joints.

Degenerative disease with disc height loss at L3-4, L4-5 and L5-S1.

Articular cartilage and labrum

Articular cartilage:  No chondral defect.

Labrum: Grossly intact, but evaluation is limited by lack of
intraarticular fluid.

Joint or bursal effusion

Joint effusion:  No hip joint effusion.  No SI joint effusion.

Bursae:  No bursa formation.

Muscles and tendons

Flexors: Normal.

Extensors: Normal.

Abductors: Normal.

Adductors: Normal.

Gluteals: Normal.

Hamstrings: Normal.

Other findings

No pelvic free fluid. No fluid collection or hematoma. No inguinal
lymphadenopathy. No inguinal hernia. Neurovascular bundles are
unremarkable.
IMPRESSION: 1. Right total hip arthroplasty with susceptibility artifact
partially obscuring the adjacent soft tissue and osseous structures.
No periarticular fluid collection or osteolysis. No hardware failure
or complication.
2.  No acute osseous injury of the pelvis.
3. Mild osteoarthritis of bilateral hips as described above.

## 2021-03-02 ENCOUNTER — Other Ambulatory Visit: Payer: Self-pay | Admitting: Orthopaedic Surgery

## 2021-03-02 DIAGNOSIS — Z96641 Presence of right artificial hip joint: Secondary | ICD-10-CM

## 2021-03-06 ENCOUNTER — Other Ambulatory Visit: Payer: Medicare Other

## 2021-03-09 IMAGING — MR MR HIP*R* W/O CM
4 of 6 series · 25 of 40 positions shown · non-contrast
Comparison: None.

CLINICAL DATA: Chronic hip pain.  Radiates down to the knee.

EXAM:
MR OF THE RIGHT HIP WITHOUT CONTRAST
TECHNIQUE: Multiplanar, multisequence MR imaging was performed. No intravenous
contrast was administered.

[Series 4: T1 · coronal · 4.0mm · 0.78mm/px · 6 of 31 slices shown]
[im 1/31]
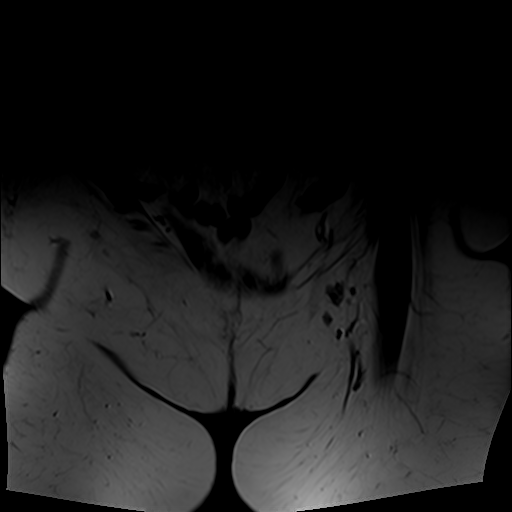
[im 7/31]
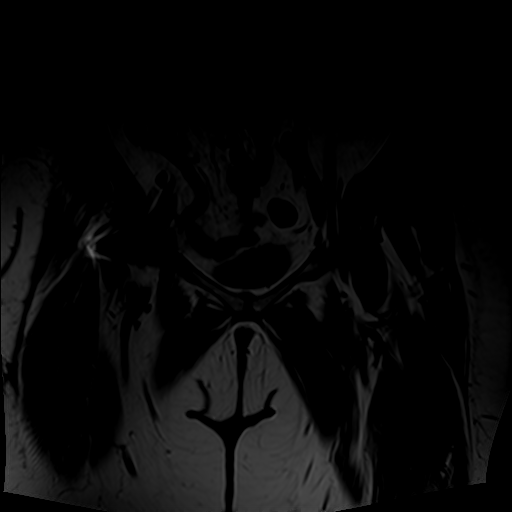
[im 13/31]
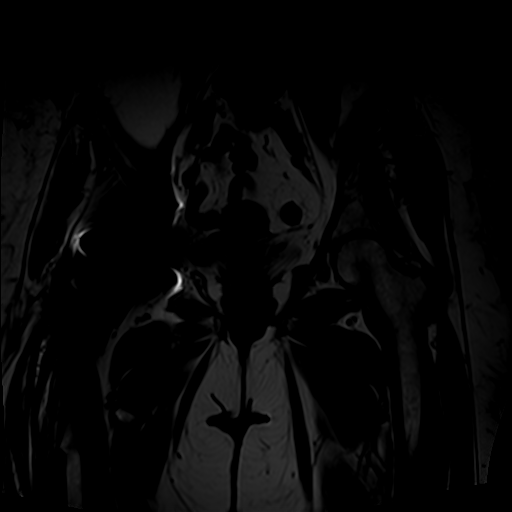
[im 19/31]
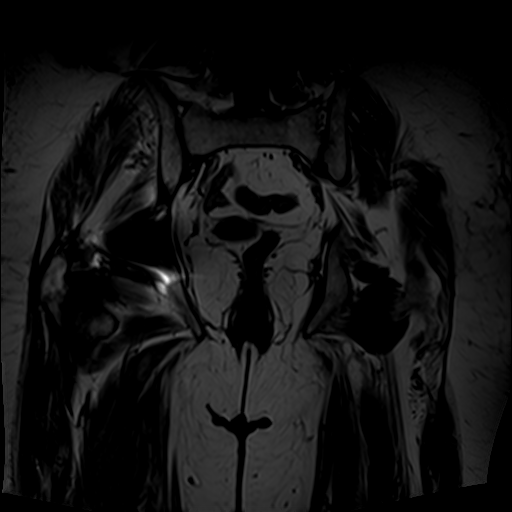
[im 25/31]
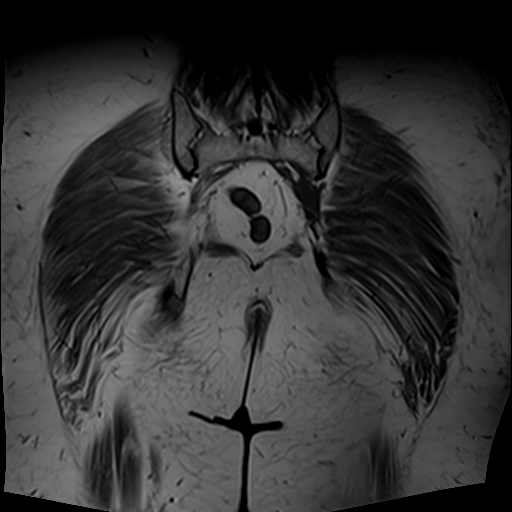
[im 31/31]
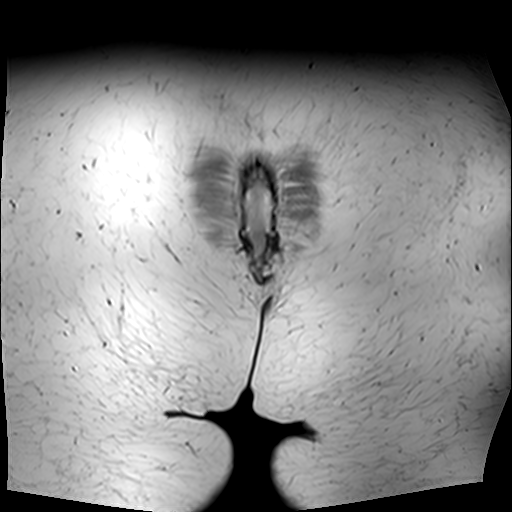

[Series 7: PD · axial · 4.0mm · 0.70mm/px · z∈[-75,+170]mm · 9 of 50 slices shown (1 of 3)]
[im 1/50]
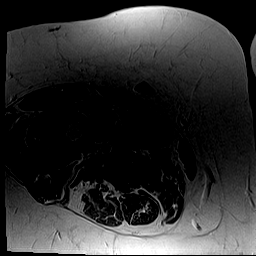
[im 7/50]
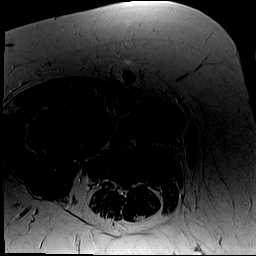
[im 13/50]
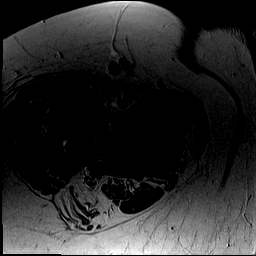
[im 19/50]
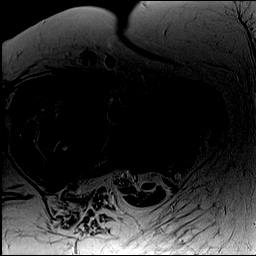
[im 25/50]
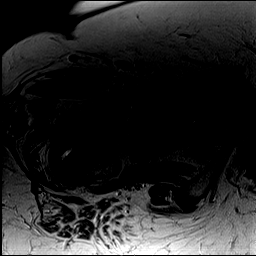
[im 31/50]
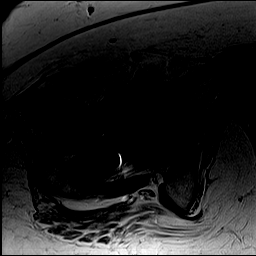
[im 37/50]
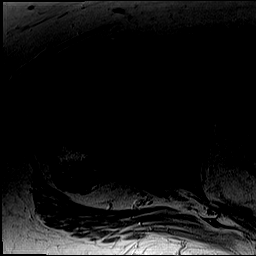
[im 43/50]
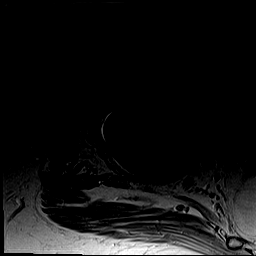
[im 50/50]
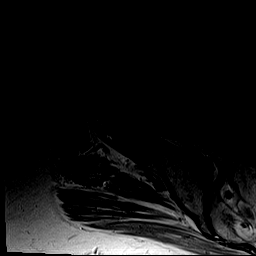

[Series 8: PD · coronal · 4.0mm · 0.47mm/px · 4 of 22 slices shown (2 of 3)]
[im 1/22]
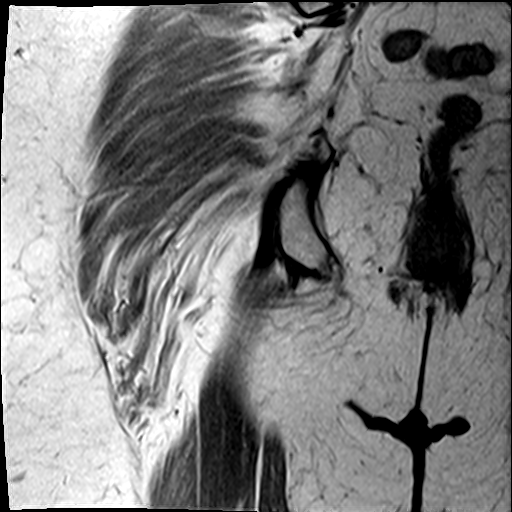
[im 8/22]
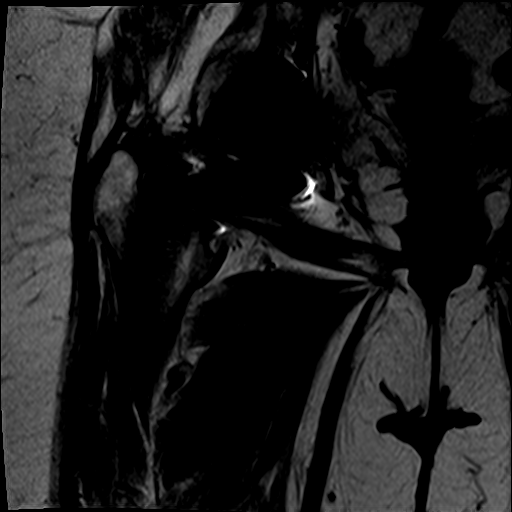
[im 15/22]
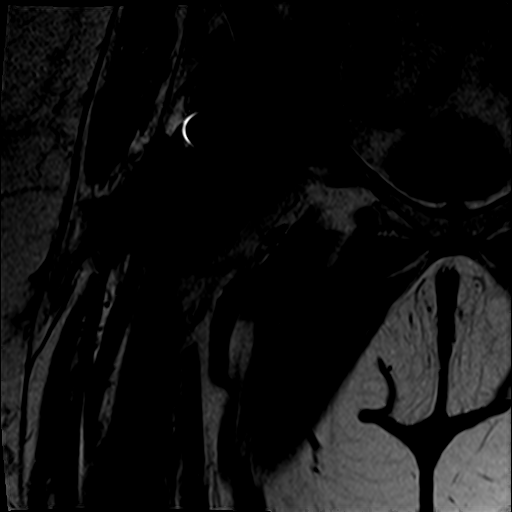
[im 22/22]
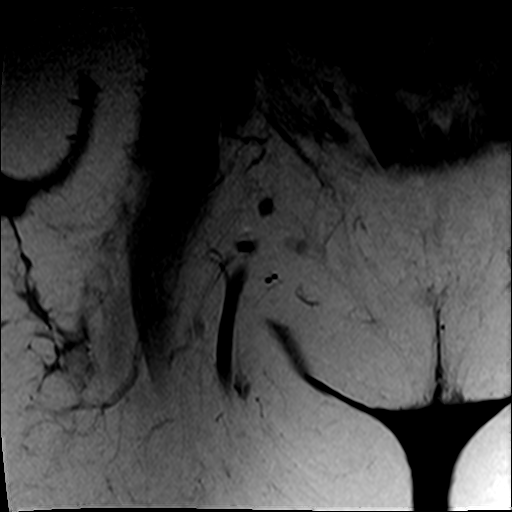

[Series 10: PD · sagittal · 4.0mm · 0.47mm/px · 6 of 31 slices shown (3 of 3)]
[im 1/31]
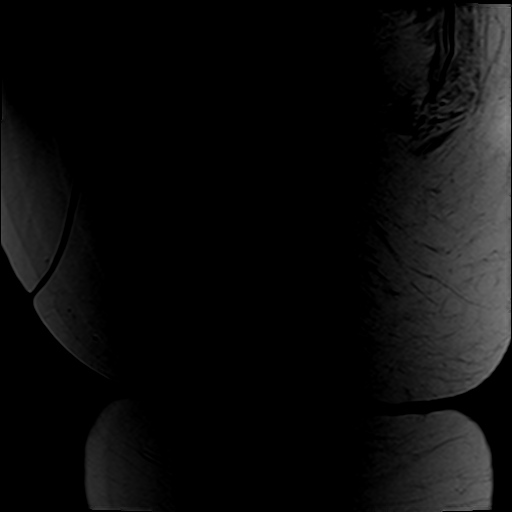
[im 7/31]
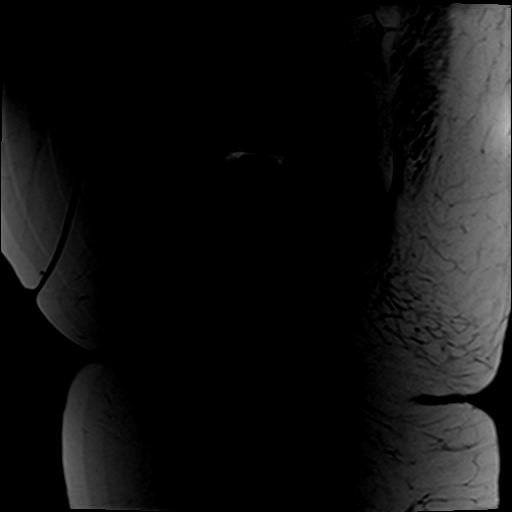
[im 13/31]
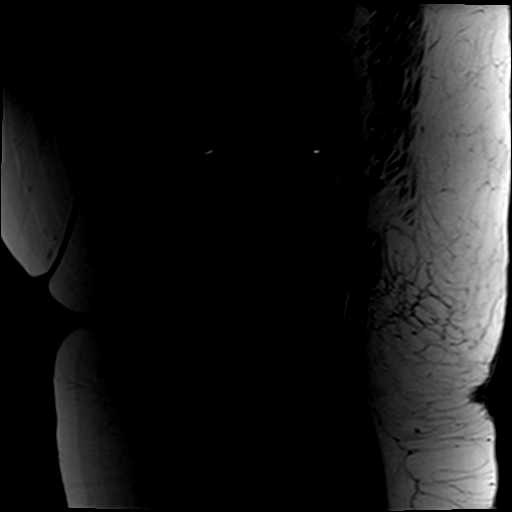
[im 19/31]
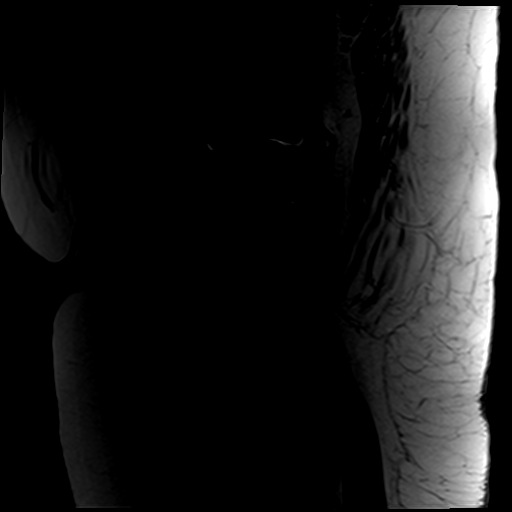
[im 25/31]
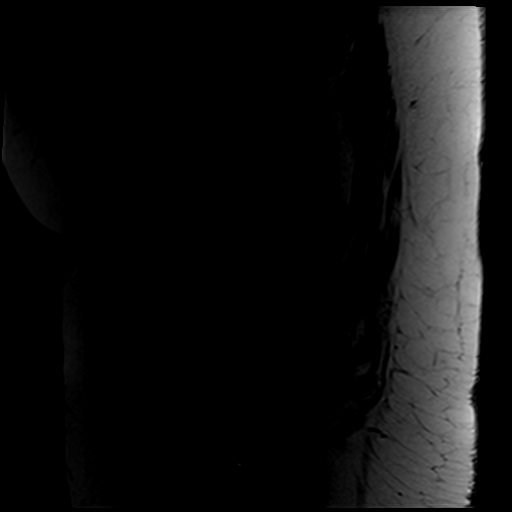
[im 31/31]
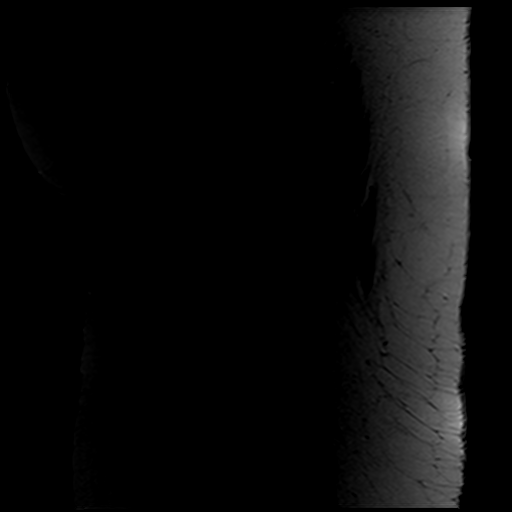

[25 of 40 positions shown; findings below may reference images not displayed]

FINDINGS: Bones:

No left hip fracture, dislocation or avascular necrosis.

Right total hip arthroplasty with susceptibility artifact partially
obscuring the adjacent soft tissue and osseous structures. No
periarticular fluid collection or osteolysis. No hardware failure or
complication.

No periosteal reaction or bone destruction. No aggressive osseous
lesion.

Mild osteoarthritis of bilateral SI joints.

Degenerative disease with disc height loss at L3-4, L4-5 and L5-S1.

Articular cartilage and labrum

Articular cartilage:  No chondral defect.

Labrum: Grossly intact, but evaluation is limited by lack of
intraarticular fluid.

Joint or bursal effusion

Joint effusion:  No hip joint effusion.  No SI joint effusion.

Bursae:  No bursa formation.

Muscles and tendons

Flexors: Normal.

Extensors: Normal.

Abductors: Normal.

Adductors: Normal.

Gluteals: Normal.

Hamstrings: Normal.

Other findings

No pelvic free fluid. No fluid collection or hematoma. No inguinal
lymphadenopathy. No inguinal hernia. Neurovascular bundles are
unremarkable.
IMPRESSION: 1. Right total hip arthroplasty with susceptibility artifact
partially obscuring the adjacent soft tissue and osseous structures.
No periarticular fluid collection or osteolysis. No hardware failure
or complication.
2.  No acute osseous injury of the pelvis.
3. Mild osteoarthritis of bilateral hips as described above.

## 2021-03-13 ENCOUNTER — Other Ambulatory Visit: Payer: Medicare Other

## 2021-03-16 ENCOUNTER — Inpatient Hospital Stay: Admission: RE | Admit: 2021-03-16 | Payer: Medicare Other | Source: Ambulatory Visit

## 2021-03-19 ENCOUNTER — Ambulatory Visit (INDEPENDENT_AMBULATORY_CARE_PROVIDER_SITE_OTHER): Payer: Medicare Other | Admitting: Orthopaedic Surgery

## 2021-03-19 ENCOUNTER — Ambulatory Visit: Payer: Self-pay

## 2021-03-19 DIAGNOSIS — Z96641 Presence of right artificial hip joint: Secondary | ICD-10-CM | POA: Diagnosis not present

## 2021-03-19 DIAGNOSIS — M25551 Pain in right hip: Secondary | ICD-10-CM

## 2021-03-19 NOTE — Progress Notes (Signed)
The patient comes in today with continued severe pain around her right hip.  We replaced her right hip almost a year ago and October 2021.  Unfortunately the acetabular component showed evidence of loosening and we took her back to the operating room in December and had to upsize the acetabular component and reposition a new 1 component with 3 screws.  Some of this was related to her body habitus being morbidly obese.  Certainly it was unfortunate event to have this happen.  She then had a chronic wound that eventually healed completely.  She does have a history remotely of a right knee replacement.  She still reports significant right hip pain that is debilitating.  A lot of her pain is in the groin.  I have sent her for MRI of her lumbar spine that did not show any significant nerve compression.  Also sent her for MRI of her right hip.  We also decided to get new x-rays of her hip today.  She does take oxycodone every 8 hours.  She has been on this medication for a long period of time.  She denies any fever, chills, nausea, vomiting.  The pain is debilitating for her though and detriment affecting her mobility, her actives daily living and her quality of life.  With her sitting I can easily put her hip through internal and external rotation with no pain in the groin and no pain with rotation at all.  Her pain is mainly with weightbearing.  The MRI of her pelvis and right hip shows no fluid collections around the hip.  There is no evidence of complicating features of the hardware itself.  I did obtain new plain films of the hip replacement today.  I did not see any significant worrisome findings around the acetabular implant.  It is in the same position that it is been since her revision surgery.  Certainly there likely needs to be some more bone ingrowth with time.  I gave her reassurance that thus far her hip replacement looks stable.  I am concerned about the amount of pain that she is having.  I want  her to go very slow and limit her mobility to allow this to hopefully fully bone ingrowth.  I would like to see her back in 3 months.  At that visit I would like an AP and lateral of her right hip.  This needs to be done with her supine and not standing.

## 2021-04-17 ENCOUNTER — Telehealth: Payer: Self-pay | Admitting: Orthopaedic Surgery

## 2021-04-17 NOTE — Telephone Encounter (Signed)
Received call from patient. She needs a release for records form. Form mailed per her request.

## 2021-06-15 ENCOUNTER — Ambulatory Visit: Payer: Medicare Other | Admitting: Orthopaedic Surgery

## 2021-06-23 ENCOUNTER — Other Ambulatory Visit: Payer: Self-pay | Admitting: Orthopaedic Surgery
